# Patient Record
Sex: Female | Born: 1970
Health system: Southern US, Community
[De-identification: ages and names within clinical notes are randomized; demographics above are authoritative.]

## PROBLEM LIST (undated history)

## (undated) DIAGNOSIS — E785 Hyperlipidemia, unspecified: Secondary | ICD-10-CM

## (undated) DIAGNOSIS — F419 Anxiety disorder, unspecified: Secondary | ICD-10-CM

## (undated) HISTORY — DX: Hyperlipidemia, unspecified: E78.5

## (undated) HISTORY — PX: TUBAL LIGATION: SHX77

## (undated) HISTORY — PX: ABDOMINAL HYSTERECTOMY: SHX81

## (undated) HISTORY — DX: Anxiety disorder, unspecified: F41.9

## (undated) HISTORY — PX: OOPHORECTOMY: SHX86

## (undated) HISTORY — PX: TONSILLECTOMY: SUR1361

---

## 2015-05-12 DIAGNOSIS — M5136 Other intervertebral disc degeneration, lumbar region: Secondary | ICD-10-CM | POA: Insufficient documentation

## 2015-05-12 DIAGNOSIS — G8929 Other chronic pain: Secondary | ICD-10-CM | POA: Insufficient documentation

## 2015-05-12 DIAGNOSIS — F410 Panic disorder [episodic paroxysmal anxiety] without agoraphobia: Secondary | ICD-10-CM | POA: Insufficient documentation

## 2015-05-12 DIAGNOSIS — M7130 Other bursal cyst, unspecified site: Secondary | ICD-10-CM | POA: Insufficient documentation

## 2015-05-12 DIAGNOSIS — M542 Cervicalgia: Secondary | ICD-10-CM | POA: Insufficient documentation

## 2015-05-12 DIAGNOSIS — M8580 Other specified disorders of bone density and structure, unspecified site: Secondary | ICD-10-CM | POA: Insufficient documentation

## 2015-05-12 DIAGNOSIS — G43009 Migraine without aura, not intractable, without status migrainosus: Secondary | ICD-10-CM | POA: Insufficient documentation

## 2017-12-19 DIAGNOSIS — Z1231 Encounter for screening mammogram for malignant neoplasm of breast: Secondary | ICD-10-CM | POA: Diagnosis not present

## 2018-03-09 DIAGNOSIS — N3946 Mixed incontinence: Secondary | ICD-10-CM | POA: Diagnosis not present

## 2018-03-09 DIAGNOSIS — R3121 Asymptomatic microscopic hematuria: Secondary | ICD-10-CM | POA: Diagnosis not present

## 2018-03-09 DIAGNOSIS — R3982 Chronic bladder pain: Secondary | ICD-10-CM | POA: Diagnosis not present

## 2018-03-09 DIAGNOSIS — N3001 Acute cystitis with hematuria: Secondary | ICD-10-CM | POA: Diagnosis not present

## 2018-03-10 DIAGNOSIS — E782 Mixed hyperlipidemia: Secondary | ICD-10-CM | POA: Diagnosis not present

## 2018-03-10 DIAGNOSIS — R5383 Other fatigue: Secondary | ICD-10-CM | POA: Diagnosis not present

## 2018-03-12 DIAGNOSIS — R799 Abnormal finding of blood chemistry, unspecified: Secondary | ICD-10-CM | POA: Diagnosis not present

## 2018-04-15 DIAGNOSIS — N393 Stress incontinence (female) (male): Secondary | ICD-10-CM | POA: Diagnosis not present

## 2018-04-15 DIAGNOSIS — R350 Frequency of micturition: Secondary | ICD-10-CM | POA: Diagnosis not present

## 2018-04-15 DIAGNOSIS — N3021 Other chronic cystitis with hematuria: Secondary | ICD-10-CM | POA: Diagnosis not present

## 2018-04-29 DIAGNOSIS — R3121 Asymptomatic microscopic hematuria: Secondary | ICD-10-CM | POA: Diagnosis not present

## 2018-04-29 DIAGNOSIS — N281 Cyst of kidney, acquired: Secondary | ICD-10-CM | POA: Diagnosis not present

## 2018-05-20 DIAGNOSIS — R799 Abnormal finding of blood chemistry, unspecified: Secondary | ICD-10-CM | POA: Diagnosis not present

## 2018-05-20 DIAGNOSIS — N3946 Mixed incontinence: Secondary | ICD-10-CM | POA: Diagnosis not present

## 2018-05-20 DIAGNOSIS — E782 Mixed hyperlipidemia: Secondary | ICD-10-CM | POA: Diagnosis not present

## 2018-05-28 DIAGNOSIS — N3021 Other chronic cystitis with hematuria: Secondary | ICD-10-CM | POA: Diagnosis not present

## 2018-05-28 DIAGNOSIS — R350 Frequency of micturition: Secondary | ICD-10-CM | POA: Diagnosis not present

## 2019-02-17 LAB — HM MAMMOGRAPHY

## 2019-04-19 ENCOUNTER — Other Ambulatory Visit: Payer: Self-pay | Admitting: Physician Assistant

## 2019-05-22 ENCOUNTER — Other Ambulatory Visit: Payer: Self-pay | Admitting: Physician Assistant

## 2019-05-31 ENCOUNTER — Encounter: Payer: Self-pay | Admitting: Physician Assistant

## 2019-05-31 ENCOUNTER — Other Ambulatory Visit: Payer: Self-pay

## 2019-05-31 ENCOUNTER — Ambulatory Visit: Payer: Commercial Managed Care - PPO | Admitting: Physician Assistant

## 2019-05-31 VITALS — BP 126/64 | HR 72 | Temp 97.4°F | Resp 16 | Ht 67.0 in | Wt 160.0 lb

## 2019-05-31 DIAGNOSIS — E782 Mixed hyperlipidemia: Secondary | ICD-10-CM | POA: Diagnosis not present

## 2019-05-31 DIAGNOSIS — F419 Anxiety disorder, unspecified: Secondary | ICD-10-CM | POA: Insufficient documentation

## 2019-05-31 DIAGNOSIS — N3010 Interstitial cystitis (chronic) without hematuria: Secondary | ICD-10-CM | POA: Diagnosis not present

## 2019-05-31 NOTE — Assessment & Plan Note (Signed)
Well controlled.  ?No changes to medicines.  ?Continue to work on eating a healthy diet and exercise.  ?Labs drawn today.  ?

## 2019-05-31 NOTE — Progress Notes (Signed)
Established Patient Office Visit  Subjective:  Patient ID: Jeanne Jacobson, female    DOB: 14-Nov-1970  Age: 49 y.o. MRN: [id number]  CC:  Chief Complaint  Patient presents with  . Hyperlipidemia    HPI Jeanne Jacobson presents for follow up hyperlipidemia  Mixed hyperlipidemia  Pt presents with hyperlipidemia.  Compliance with treatment has been good; The patient is compliant with medications, maintains a low cholesterol diet , follows up as directed.  The patient denies experiencing any hypercholesterolemia related symptoms.  Evidenced based information based on history , exam, and other sources has been used  for decision making. Pt currently on crestor 28m qd  Pt with history of anxiety - states symptoms controlled well with ativan 0.52m- she takes nightly  Pt has symptoms consistent with probable interstitial cystitis - she did have urology workup last year and was supposed to return for further testing if her symptoms persisted.  She states she never went back and has intermittent symptoms - states she would consider possible second opinion because she did not necessarily like the visit she had with [facility]   Past Medical History:  Diagnosis Date  . Anxiety   . Hyperlipidemia     Past Surgical History:  Procedure Laterality Date  . ABDOMINAL HYSTERECTOMY    . TUBAL LIGATION      Family History  Problem Relation Age of Onset  . Coronary artery disease Father     Social History   Socioeconomic History  . Marital status: Married    Spouse name: Not on file  . Number of children: 2  . Years of education: Not on file  . Highest education level: Not on file  Occupational History  . Occupation: [employer]  Tobacco Use  . Smoking status: Never Smoker  . Smokeless tobacco: Never Used  Substance and Sexual Activity  . Alcohol use: Yes    Comment: social  . Drug use: Never  . Sexual activity: Not on file  Other Topics Concern  . Not on file  Social  History Narrative  . Not on file   Social Determinants of Health   Financial Resource Strain:   . Difficulty of Paying Living Expenses:   Food Insecurity:   . Worried About RuCharity fundraisern the Last Year:   . RaArboriculturistn the Last Year:   Transportation Needs:   . LaFilm/video editorMedical):   . Marland Kitchenack of Transportation (Non-Medical):   Physical Activity:   . Days of Exercise per Week:   . Minutes of Exercise per Session:   Stress:   . Feeling of Stress :   Social Connections:   . Frequency of Communication with Friends and Family:   . Frequency of Social Gatherings with Friends and Family:   . Attends Religious Services:   . Active Member of Clubs or Organizations:   . Attends ClArchivisteetings:   . Marland Kitchenarital Status:   Intimate Partner Violence:   . Fear of Current or Ex-Partner:   . Emotionally Abused:   . Marland Kitchenhysically Abused:   . Sexually Abused:      Current Outpatient Medications:  .  gabapentin (NEURONTIN) 300 MG capsule, TAKE 1 CAPSULE EVERY MORNING AND 2 CAPSULES AT BEDTIME, Disp: , Rfl:  .  estradiol (ESTRACE) 2 MG tablet, Take 2 mg by mouth daily., Disp: , Rfl:  .  LORazepam (ATIVAN) 0.5 MG tablet, TAKE 1 TABLET BY MOUTH ONCE DAILY AS NEEDED, Disp: 30  tablet, Rfl: 0 .  rosuvastatin (CRESTOR) 10 MG tablet, TAKE 1 TABLET DAILY, Disp: 90 tablet, Rfl: 3   Allergies  Allergen Reactions  . Penicillins     ROS CONSTITUTIONAL: Negative for chills, fatigue, fever, unintentional weight gain and unintentional weight loss.  E/N/T: Negative for ear pain, nasal congestion and sore throat.  CARDIOVASCULAR: Negative for chest pain, dizziness, palpitations and pedal edema.  RESPIRATORY: Negative for recent cough and dyspnea.  GASTROINTESTINAL: Negative for abdominal pain, acid reflux symptoms, constipation, diarrhea, nausea and vomiting.  GU - see HPI INTEGUMENTARY: Negative for rash.   PSYCHIATRIC: Negative for sleep disturbance and to question  depression screen.  Negative for depression, negative for anhedonia.        Objective:    PHYSICAL EXAM:   VS: BP 126/64   Pulse 72   Temp (!) 97.4 F (36.3 C)   Resp 16   Ht 5' 7"  (1.702 m)   Wt 160 lb (72.6 kg)   SpO2 99%   BMI 25.06 kg/m   GEN: Well nourished, well developed, in no acute distress   Cardiac: RRR; no murmurs, rubs, or gallops,no edema - no significant varicosities Respiratory:  normal respiratory rate and pattern with no distress - normal breath sounds with no rales, rhonchi, wheezes or rubs  MS: no deformity or atrophy  Skin: warm and dry, no rash  Neuro:  Alert and Oriented x 3, Strength and sensation are intact - CN II-Xii grossly intact Psych: euthymic mood, appropriate affect and demeanor  BP 126/64   Pulse 72   Temp (!) 97.4 F (36.3 C)   Resp 16   Ht 5' 7"  (1.702 m)   Wt 160 lb (72.6 kg)   SpO2 99%   BMI 25.06 kg/m  Wt Readings from Last 3 Encounters:  05/31/19 160 lb (72.6 kg)     Health Maintenance Due  Topic Date Due  . HIV Screening  Never done  . TETANUS/TDAP  Never done  . PAP SMEAR-Modifier  Never done  . INFLUENZA VACCINE  Never done    There are no preventive care reminders to display for this patient.  No results found for: TSH No results found for: WBC, HGB, HCT, MCV, PLT No results found for: NA, K, CHLORIDE, CO2, GLUCOSE, BUN, CREATININE, BILITOT, ALKPHOS, AST, ALT, PROT, ALBUMIN, CALCIUM, ANIONGAP, EGFR, GFR No results found for: CHOL No results found for: HDL No results found for: LDLCALC No results found for: TRIG No results found for: CHOLHDL No results found for: HGBA1C    Assessment & Plan:   Problem List Items Addressed This Visit      Genitourinary   Interstitial cystitis    Handout given for diet guidelines Pt will try this and if not helpful will call back and get second opinion        Other   Mixed hyperlipidemia - Primary    Well controlled.  No changes to medicines.  Continue to work on  eating a healthy diet and exercise.  Labs drawn today.        Relevant Orders   Comprehensive metabolic panel   Lipid panel   Anxiety    Continue current meds as directed        conti No orders of the defined types were placed in this encounter.   Follow-up: Return in about 6 months (around 12/01/2019) for chronic fasting follow up.    SARA R Dearia Wilmouth, PA-C

## 2019-05-31 NOTE — Assessment & Plan Note (Signed)
Continue current meds as directed 

## 2019-05-31 NOTE — Assessment & Plan Note (Signed)
Handout given for diet guidelines Pt will try this and if not helpful will call back and get second opinion

## 2019-05-31 NOTE — Patient Instructions (Signed)
Eating Plan for Interstitial Cystitis Interstitial cystitis (IC) is a long-term (chronic) condition that causes pain and pressure in the bladder, the lower abdomen, and the pelvic area. Other symptoms of IC include urinary urgency and frequency. Symptoms tend to come and go. Many people with IC find that certain foods trigger their symptoms. Different foods may be problematic for different people. Some foods are more likely to cause symptoms than others. Learning which foods bother you and which do not can help you come up with an eating plan to manage IC. What are tips for following this plan? You may find it helpful to work with a dietitian. This health care provider can help you develop your eating plan by doing an elimination diet, which involves these steps:  Start with a list of foods that you think trigger your IC symptoms along with the foods that most commonly trigger symptoms for many people with IC.  Eliminate those foods from your diet for about one month, then start reintroducing the foods one at a time to see which ones trigger your symptoms.  Make a list of the foods that trigger your symptoms. It may take several months to find out which foods bother you. Reading food labels Once you know which foods trigger your IC symptoms, you can avoid them. However, it is also a good idea to read food labels because some foods that trigger your symptoms may be included as ingredients in other foods. These ingredients may include:  Chili peppers.  Tomato products.  Soy.  Worcestershire sauce.  Vinegar.  Alcohol.  Citrus flavors or juices.  Artificial sweeteners.  Monosodium glutamate. Shopping  Shopping can be a challenge if many foods trigger your IC. When you go grocery shopping, bring a list of the foods you can eat.  You can get an app for your phone that lets you know which foods are the safest and which you may want to avoid. You can find the app at the Interstitial  Cystitis Network website: www.ic-network.com Meal planning  Plan your meals according to the results of your elimination diet. If you have not done an elimination diet, plan meals according to IC food lists recommended by your health care provider or dietitian. These lists tell you which foods are least and most likely to cause symptoms.  Avoid certain types of food when you go out to eat, such as pizza and foods typically served at Indian, Mexican, and Thai restaurants. These foods often contain ingredients that can aggravate IC. General information Here are some general guidelines for an IC eating plan:  Do not eat large portions.  Drink plenty of fluids with your meals.  Do not eat foods that are high in sugar, salt, or saturated fat.  Choose whole fruits instead of juice.  Eat a colorful variety of vegetables. What foods should I eat? For people with IC, the best diet is a balanced one that includes things from all the food groups. Even if you have to avoid certain foods, there are still plenty of healthy choices in each group. The following are some foods that are least bothersome and may be safest to eat: Fruits Bananas. Blueberries and blueberry juice. Melons. Pears. Apples. Dates. Prunes. Raisins. Apricots. Vegetables Asparagus. Avocado. Celery. Beets. Bell peppers. Black olives. Broccoli. Brussels sprouts. Cabbage. Carrots. Cauliflower. Cucumber. Eggplant. Green beans. Potatoes. Radishes. Spinach. Squash. Turnips. Zucchini. Mushrooms. Peas. Grains Oats. Rice. Bran. Oatmeal. Whole wheat bread. Meats and other proteins Beef. Fish and other seafood. Eggs. Nuts.   Peanut butter. Pork. Poultry. Lamb. Garbanzo beans. Pinto beans. Dairy Whole or low-fat milk. American, mozzarella, mild cheddar, feta, ricotta, and cream cheeses. The items listed above may not be a complete list of foods and beverages you can eat. Contact a dietitian for more information. What foods should I avoid? You  should avoid any foods that seem to trigger your symptoms. It is also a good idea to avoid foods that are most likely to cause symptoms in many people with IC. These include the following: Fruits Citrus fruits, including lemons, limes, oranges, and grapefruit. Cranberries. Strawberries. Pineapple. Kiwi. Vegetables Chili peppers. Onions. Sauerkraut. Tomato and tomato products. Pickles. Grains You do not need to avoid any type of grain unless it triggers your symptoms. Meats and other proteins Precooked or cured meats, such as sausages or meat loaves. Soy products. Dairy Chocolate ice cream. Processed cheese. Yogurt. Beverages Alcohol. Chocolate drinks. Coffee. Cranberry juice. Carbonated drinks. Tea (black, green, or herbal). Tomato juice. Sports drinks. The items listed above may not be a complete list of foods and beverages you should avoid. Contact a dietitian for more information. Summary  Many people with IC find that certain foods trigger their symptoms. Different foods may be problematic for different people. Some foods are more likely to cause symptoms than others.  You may find it helpful to work with a dietitian to do an elimination diet and come up with an eating plan that is right for you.  Plan your meals according to the results of your elimination diet. If you have not done an elimination diet, plan your meals using IC food lists. These lists tell you which foods are least and most likely to cause symptoms.  The best diet for people with IC is a balanced diet that includes foods from all the food groups. Even if you have to avoid certain foods, there are still plenty of healthy choices in each group. This information is not intended to replace advice given to you by your health care provider. Make sure you discuss any questions you have with your health care provider. Document Revised: 06/18/2018 Document Reviewed: 10/30/2017 Elsevier Patient Education  2020 Elsevier Inc.  

## 2019-06-01 LAB — COMPREHENSIVE METABOLIC PANEL
ALT: 8 IU/L (ref 0–32)
AST: 11 IU/L (ref 0–40)
Albumin/Globulin Ratio: 1.8 (ref 1.2–2.2)
Albumin: 4.4 g/dL (ref 3.8–4.8)
Alkaline Phosphatase: 58 IU/L (ref 39–117)
BUN/Creatinine Ratio: 16 (ref 9–23)
BUN: 14 mg/dL (ref 6–24)
Bilirubin Total: 0.2 mg/dL (ref 0.0–1.2)
CO2: 23 mmol/L (ref 20–29)
Calcium: 9.5 mg/dL (ref 8.7–10.2)
Chloride: 104 mmol/L (ref 96–106)
Creatinine, Ser: 0.88 mg/dL (ref 0.57–1.00)
GFR calc Af Amer: 89 mL/min/{1.73_m2} (ref >59)
GFR calc non Af Amer: 77 mL/min/{1.73_m2} (ref >59)
Globulin, Total: 2.5 g/dL (ref 1.5–4.5)
Glucose: 93 mg/dL (ref 65–99)
Potassium: 5.1 mmol/L (ref 3.5–5.2)
Sodium: 141 mmol/L (ref 134–144)
Total Protein: 6.9 g/dL (ref 6.0–8.5)

## 2019-06-01 LAB — LIPID PANEL
Chol/HDL Ratio: 2.5 ratio (ref 0.0–4.4)
Cholesterol, Total: 171 mg/dL (ref 100–199)
HDL: 68 mg/dL (ref >39)
LDL Chol Calc (NIH): 75 mg/dL (ref 0–99)
Triglycerides: 169 mg/dL — ABNORMAL HIGH (ref 0–149)
VLDL Cholesterol Cal: 28 mg/dL (ref 5–40)

## 2019-06-01 LAB — CARDIOVASCULAR RISK ASSESSMENT

## 2019-06-04 ENCOUNTER — Other Ambulatory Visit: Payer: Self-pay | Admitting: Physician Assistant

## 2019-06-08 ENCOUNTER — Other Ambulatory Visit: Payer: Self-pay | Admitting: Physician Assistant

## 2019-08-10 ENCOUNTER — Other Ambulatory Visit: Payer: Self-pay | Admitting: Physician Assistant

## 2019-08-12 ENCOUNTER — Other Ambulatory Visit: Payer: Self-pay | Admitting: Physician Assistant

## 2019-09-11 ENCOUNTER — Other Ambulatory Visit: Payer: Self-pay | Admitting: Physician Assistant

## 2019-09-21 ENCOUNTER — Telehealth (INDEPENDENT_AMBULATORY_CARE_PROVIDER_SITE_OTHER): Payer: Commercial Managed Care - PPO | Admitting: Physician Assistant

## 2019-09-21 ENCOUNTER — Encounter: Payer: Self-pay | Admitting: Physician Assistant

## 2019-09-21 VITALS — Wt 160.0 lb

## 2019-09-21 DIAGNOSIS — J060 Acute laryngopharyngitis: Secondary | ICD-10-CM | POA: Diagnosis not present

## 2019-09-21 MED ORDER — AZITHROMYCIN 250 MG PO TABS: ORAL_TABLET | ORAL | 0 refills | Status: DC

## 2019-09-21 NOTE — Assessment & Plan Note (Signed)
rx for zpack as directed Continue decongestants Salt water gargles

## 2019-09-21 NOTE — Progress Notes (Signed)
Virtual Visit via Telephone Note   This visit type was conducted due to national recommendations for restrictions regarding the COVID-19 Pandemic (e.g. social distancing) in an effort to limit this patient's exposure and mitigate transmission in our community.  Due to her co-morbid illnesses, this patient is at least at moderate risk for complications without adequate follow up.  This format is felt to be most appropriate for this patient at this time.  The patient did not have access to video technology/had technical difficulties with video requiring transitioning to audio format only (telephone).  All issues noted in this document were discussed and addressed.  No physical exam could be performed with this format.  Patient verbally consented to a telehealth visit.   Date:  09/21/2019   ID:  Beckey Downing, DOB [birth year], MRN [id number]  Patient Location: Home Provider Location: Office  PCP:  Marianne Sofia, PA-C    Chief Complaint:  URI  History of Present Illness:    Laelah Matsushita is a 49 y.o. female with uri Pt states for the past week she has had mild sore throat, sinus drainage, facial pressure and pnd - is taking otc decongestants Denies fever ,malaise or cough Does feel left ear stopped up   The patient does not have symptoms concerning for COVID-19 infection (fever, chills, cough, or new shortness of breath).    Past Medical History:  Diagnosis Date  . Anxiety   . Hyperlipidemia    Past Surgical History:  Procedure Laterality Date  . ABDOMINAL HYSTERECTOMY    . TUBAL LIGATION       Current Meds  Medication Sig  . estradiol (ESTRACE) 2 MG tablet TAKE 1 TABLET DAILY  . gabapentin (NEURONTIN) 300 MG capsule TAKE 1 CAPSULE EVERY MORNING AND 2 CAPSULES AT BEDTIME  . loratadine (CLARITIN) 10 MG tablet Take 10 mg by mouth daily.  Marland Kitchen LORazepam (ATIVAN) 0.5 MG tablet TAKE 1 TABLET BY MOUTH EVERY DAY AS NEEDED  . Phenylephrine-APAP-guaiFENesin (MUCINEX SINUS-MAX CONG &  PAIN PO) Take by mouth.  . rosuvastatin (CRESTOR) 10 MG tablet TAKE 1 TABLET DAILY     Allergies:   Penicillins   Social History   Tobacco Use  . Smoking status: Never Smoker  . Smokeless tobacco: Never Used  Vaping Use  . Vaping Use: Never used  Substance Use Topics  . Alcohol use: Yes    Comment: social  . Drug use: Never     Family Hx: The patient's family history includes Coronary artery disease in her father.  ROS:   Please see the history of present illness.    All other systems reviewed and are negative.  Labs/Other Tests and Data Reviewed:    Recent Labs: 05/31/2019: ALT 8; BUN 14; Creatinine, Ser 0.88; Potassium 5.1; Sodium 141   Recent Lipid Panel Lab Results  Component Value Date/Time   CHOL 171 05/31/2019 08:56 AM   TRIG 169 (H) 05/31/2019 08:56 AM   HDL 68 05/31/2019 08:56 AM   CHOLHDL 2.5 05/31/2019 08:56 AM   LDLCALC 75 05/31/2019 08:56 AM    Wt Readings from Last 3 Encounters:  09/21/19 160 lb (72.6 kg)  05/31/19 160 lb (72.6 kg)     Objective:    Vital Signs:  Wt 160 lb (72.6 kg)   BMI 25.06 kg/m    VITAL SIGNS:  reviewed  ASSESSMENT & PLAN:    1. URI - rx for zpack - continue decongestants - recommend salt water gargles  COVID-19 Education: The signs and symptoms  of COVID-19 were discussed with the patient and how to seek care for testing (follow up with PCP or arrange E-visit). The importance of social distancing was discussed today.  Time:   Today, I have spent 10 minutes with the patient with telehealth technology discussing the above problems.     Medication Adjustments/Labs and Tests Ordered: Current medicines are reviewed at length with the patient today.  Concerns regarding medicines are outlined above.   Tests Ordered: No orders of the defined types were placed in this encounter.   Medication Changes: No orders of the defined types were placed in this encounter.   Follow Up:  In Person prn  Signed, Vickey Sages,  PA-C  09/21/2019 2:05 PM    [facility] [employer]

## 2019-10-15 ENCOUNTER — Other Ambulatory Visit: Payer: Self-pay | Admitting: Physician Assistant

## 2019-11-10 ENCOUNTER — Other Ambulatory Visit: Payer: Self-pay | Admitting: Physician Assistant

## 2019-11-29 ENCOUNTER — Other Ambulatory Visit: Payer: Self-pay

## 2019-11-29 ENCOUNTER — Encounter: Payer: Self-pay | Admitting: Physician Assistant

## 2019-11-29 ENCOUNTER — Ambulatory Visit: Payer: Commercial Managed Care - PPO | Admitting: Physician Assistant

## 2019-11-29 VITALS — BP 118/74 | HR 76 | Temp 97.3°F | Ht 67.0 in | Wt 159.0 lb

## 2019-11-29 DIAGNOSIS — M5136 Other intervertebral disc degeneration, lumbar region: Secondary | ICD-10-CM | POA: Diagnosis not present

## 2019-11-29 DIAGNOSIS — N3010 Interstitial cystitis (chronic) without hematuria: Secondary | ICD-10-CM

## 2019-11-29 DIAGNOSIS — E782 Mixed hyperlipidemia: Secondary | ICD-10-CM | POA: Diagnosis not present

## 2019-11-29 DIAGNOSIS — F419 Anxiety disorder, unspecified: Secondary | ICD-10-CM

## 2019-11-29 DIAGNOSIS — Z1211 Encounter for screening for malignant neoplasm of colon: Secondary | ICD-10-CM

## 2019-11-29 NOTE — Progress Notes (Signed)
Established Patient Office Visit  Subjective:  Patient ID: Jeanne Jacobson, female    DOB: 1970-07-01  Age: 49 y.o. MRN: [id number]  CC:  Chief Complaint  Patient presents with   Hyperlipidemia    HPI Jeanne Jacobson presents for follow up hyperlipidemia  Mixed hyperlipidemia  Pt presents with hyperlipidemia.  Compliance with treatment has been good; The patient is compliant with medications, maintains a low cholesterol diet , follows up as directed.  The patient denies experiencing any hypercholesterolemia related symptoms.  Evidenced based information based on history , exam, and other sources has been used  for decision making. Pt currently on crestor 10mg  qd  Pt with history of anxiety - states symptoms controlled well with ativan 0.5mg  - she takes nightly  Pt has symptoms consistent with probable interstitial cystitis - she has had a workup in the past with [facility] and would like a second opinion  Pt with symptoms of DJD and chronic recurrent low back and bilateral hip pain - she has been seeing chiropractor but would like ortho referral for further evaluation and treatment  Pt would like to be scheduled for screening colonoscopy   Past Medical History:  Diagnosis Date   Anxiety    Hyperlipidemia     Past Surgical History:  Procedure Laterality Date   ABDOMINAL HYSTERECTOMY     TUBAL LIGATION      Family History  Problem Relation Age of Onset   Coronary artery disease Father     Social History   Socioeconomic History   Marital status: Married    Spouse name: Not on file   Number of children: 2   Years of education: Not on file   Highest education level: Not on file  Occupational History   Occupation:  Tobacco Use   Smoking status: Never Smoker   Smokeless tobacco: Never Used  Sports administrator Use: Never used  Substance and Sexual Activity   Alcohol use: Yes    Comment: social   Drug use: Never   Sexual  activity: Not on file  Other Topics Concern   Not on file  Social History Narrative   Not on file   Social Determinants of Health   Financial Resource Strain:    Difficulty of Paying Living Expenses: Not on file  Food Insecurity:    Worried About Building services engineer in the Last Year: Not on file   Programme researcher, broadcasting/film/video of Food in the Last Year: Not on file  Transportation Needs:    Lack of Transportation (Medical): Not on file   Lack of Transportation (Non-Medical): Not on file  Physical Activity:    Days of Exercise per Week: Not on file   Minutes of Exercise per Session: Not on file  Stress:    Feeling of Stress : Not on file  Social Connections:    Frequency of Communication with Friends and Family: Not on file   Frequency of Social Gatherings with Friends and Family: Not on file   Attends Religious Services: Not on file   Active Member of Clubs or Organizations: Not on file   Attends [employer] Meetings: Not on file   Marital Status: Not on file  Intimate Partner Violence:    Fear of Current or Ex-Partner: Not on file   Emotionally Abused: Not on file   Physically Abused: Not on file   Sexually Abused: Not on file     Current Outpatient Medications:    estradiol (ESTRACE)  2 MG tablet, TAKE 1 TABLET DAILY, Disp: 90 tablet, Rfl: 0   gabapentin (NEURONTIN) 100 MG capsule, Take by mouth., Disp: , Rfl:    LORazepam (ATIVAN) 0.5 MG tablet, TAKE 1 TABLET BY MOUTH EVERY DAY AS NEEDED, Disp: 30 tablet, Rfl: 1   rosuvastatin (CRESTOR) 10 MG tablet, TAKE 1 TABLET DAILY, Disp: 90 tablet, Rfl: 3   Allergies  Allergen Reactions   Penicillins     ROS CONSTITUTIONAL: Negative for chills, fatigue, fever, unintentional weight gain and unintentional weight loss.  E/N/T: Negative for ear pain, nasal congestion and sore throat.  CARDIOVASCULAR: Negative for chest pain, dizziness, palpitations and pedal edema.  RESPIRATORY: Negative for recent cough and dyspnea.   GASTROINTESTINAL: Negative for abdominal pain, acid reflux symptoms, constipation, diarrhea, nausea and vomiting.  GU - see HPI MUSC - see HPI INTEGUMENTARY: Negative for rash.   PSYCHIATRIC: Negative for sleep disturbance and to question depression screen.  Negative for depression, negative for anhedonia.        Objective:    PHYSICAL EXAM:   VS: BP 118/74 (BP Location: Left Arm, Patient Position: Sitting, Cuff Size: Normal)    Pulse 76    Temp (!) 97.3 F (36.3 C) (Temporal)    Ht 5\' 7"  (1.702 m)    Wt 159 lb (72.1 kg)    SpO2 100%    BMI 24.90 kg/m   GEN: Well nourished, well developed, in no acute distress   Cardiac: RRR; no murmurs, rubs, or gallops,no edema - no significant varicosities Respiratory:  normal respiratory rate and pattern with no distress - normal breath sounds with no rales, rhonchi, wheezes or rubs  MS: no deformity or atrophy  Skin: warm and dry, no rash  Neuro:  Alert and Oriented x 3, Strength and sensation are intact - CN II-Xii grossly intact Psych: euthymic mood, appropriate affect and demeanor  BP 118/74 (BP Location: Left Arm, Patient Position: Sitting, Cuff Size: Normal)    Pulse 76    Temp (!) 97.3 F (36.3 C) (Temporal)    Ht 5\' 7"  (1.702 m)    Wt 159 lb (72.1 kg)    SpO2 100%    BMI 24.90 kg/m  Wt Readings from Last 3 Encounters:  11/29/19 159 lb (72.1 kg)  09/21/19 160 lb (72.6 kg)  05/31/19 160 lb (72.6 kg)     Health Maintenance Due  Topic Date Due   COLONOSCOPY  Never done    There are no preventive care reminders to display for this patient.  No results found for: TSH No results found for: WBC, HGB, HCT, MCV, PLT Lab Results  Component Value Date   NA 141 05/31/2019   K 5.1 05/31/2019   CO2 23 05/31/2019   GLUCOSE 93 05/31/2019   BUN 14 05/31/2019   CREATININE 0.88 05/31/2019   BILITOT 0.2 05/31/2019   ALKPHOS 58 05/31/2019   AST 11 05/31/2019   ALT 8 05/31/2019   PROT 6.9 05/31/2019   ALBUMIN 4.4 05/31/2019    CALCIUM 9.5 05/31/2019   Lab Results  Component Value Date   CHOL 171 05/31/2019   Lab Results  Component Value Date   HDL 68 05/31/2019   Lab Results  Component Value Date   LDLCALC 75 05/31/2019   Lab Results  Component Value Date   TRIG 169 (H) 05/31/2019   Lab Results  Component Value Date   CHOLHDL 2.5 05/31/2019   No results found for: HGBA1C    Assessment & Plan:  Problem List Items Addressed This Visit      Musculoskeletal and Integument   DDD (degenerative disc disease), lumbar    Pt would like opinion from ortho for chronic back and hip pain (dating back to fall in 2007) Referral made      Relevant Orders   Ambulatory referral to Orthopedic Surgery     Genitourinary   Interstitial cystitis    Pt would like second opinion - had seen [facility] in past -  Referral  made      Relevant Orders   Ambulatory referral to Urology     Other   Mixed hyperlipidemia - Primary    Well controlled.  No changes to medicines.  Continue to work on eating a healthy diet and exercise.  Labs drawn today.        Relevant Orders   CBC with Differential/Platelet   Comprehensive metabolic panel   TSH   Lipid panel   Anxiety    Continue current meds as directed      Relevant Orders   TSH   Screening for colon cancer    Referral to Dr Jennye Boroughs      Relevant Orders   Ambulatory referral to Gastroenterology     conti No orders of the defined types were placed in this encounter.   Follow-up: Return in about 6 months (around 05/28/2020) for chronic fasting follow up.    SARA R Michon Kaczmarek, PA-C

## 2019-11-29 NOTE — Assessment & Plan Note (Signed)
Continue current meds as directed 

## 2019-11-29 NOTE — Assessment & Plan Note (Signed)
Referral to Dr Misenheimer 

## 2019-11-29 NOTE — Assessment & Plan Note (Signed)
Pt would like opinion from ortho for chronic back and hip pain (dating back to fall in 2007) Referral made

## 2019-11-29 NOTE — Assessment & Plan Note (Signed)
Pt would like second opinion - had seen [facility] in past -  Referral  made

## 2019-11-29 NOTE — Assessment & Plan Note (Signed)
Well controlled.  ?No changes to medicines.  ?Continue to work on eating a healthy diet and exercise.  ?Labs drawn today.  ?

## 2019-11-30 LAB — COMPREHENSIVE METABOLIC PANEL
ALT: 9 IU/L (ref 0–32)
AST: 11 IU/L (ref 0–40)
Albumin/Globulin Ratio: 1.6 (ref 1.2–2.2)
Albumin: 4.2 g/dL (ref 3.8–4.8)
Alkaline Phosphatase: 54 IU/L (ref 44–121)
BUN/Creatinine Ratio: 15 (ref 9–23)
BUN: 12 mg/dL (ref 6–24)
Bilirubin Total: 0.3 mg/dL (ref 0.0–1.2)
CO2: 23 mmol/L (ref 20–29)
Calcium: 9.2 mg/dL (ref 8.7–10.2)
Chloride: 104 mmol/L (ref 96–106)
Creatinine, Ser: 0.78 mg/dL (ref 0.57–1.00)
GFR calc Af Amer: 103 mL/min/{1.73_m2} (ref >59)
GFR calc non Af Amer: 89 mL/min/{1.73_m2} (ref >59)
Globulin, Total: 2.6 g/dL (ref 1.5–4.5)
Glucose: 96 mg/dL (ref 65–99)
Potassium: 4.4 mmol/L (ref 3.5–5.2)
Sodium: 139 mmol/L (ref 134–144)
Total Protein: 6.8 g/dL (ref 6.0–8.5)

## 2019-11-30 LAB — CBC WITH DIFFERENTIAL/PLATELET
Basophils Absolute: 0 10*3/uL (ref 0.0–0.2)
Basos: 1 %
EOS (ABSOLUTE): 0.1 10*3/uL (ref 0.0–0.4)
Eos: 2 %
Hematocrit: 36.7 % (ref 34.0–46.6)
Hemoglobin: 12.5 g/dL (ref 11.1–15.9)
Immature Grans (Abs): 0 10*3/uL (ref 0.0–0.1)
Immature Granulocytes: 0 %
Lymphocytes Absolute: 2.2 10*3/uL (ref 0.7–3.1)
Lymphs: 49 %
MCH: 30.5 pg (ref 26.6–33.0)
MCHC: 34.1 g/dL (ref 31.5–35.7)
MCV: 90 fL (ref 79–97)
Monocytes Absolute: 0.3 10*3/uL (ref 0.1–0.9)
Monocytes: 7 %
Neutrophils Absolute: 1.8 10*3/uL (ref 1.4–7.0)
Neutrophils: 41 %
Platelets: 207 10*3/uL (ref 150–450)
RBC: 4.1 x10E6/uL (ref 3.77–5.28)
RDW: 12.3 % (ref 11.7–15.4)
WBC: 4.4 10*3/uL (ref 3.4–10.8)

## 2019-11-30 LAB — LIPID PANEL
Chol/HDL Ratio: 2.6 ratio (ref 0.0–4.4)
Cholesterol, Total: 153 mg/dL (ref 100–199)
HDL: 58 mg/dL (ref >39)
LDL Chol Calc (NIH): 72 mg/dL (ref 0–99)
Triglycerides: 131 mg/dL (ref 0–149)
VLDL Cholesterol Cal: 23 mg/dL (ref 5–40)

## 2019-11-30 LAB — TSH: TSH: 2.92 u[IU]/mL (ref 0.450–4.500)

## 2019-11-30 LAB — CARDIOVASCULAR RISK ASSESSMENT

## 2020-01-19 ENCOUNTER — Other Ambulatory Visit: Payer: Self-pay | Admitting: Physician Assistant

## 2020-02-08 ENCOUNTER — Other Ambulatory Visit: Payer: Self-pay | Admitting: Physician Assistant

## 2020-03-14 ENCOUNTER — Encounter: Payer: Self-pay | Admitting: Physician Assistant

## 2020-03-14 ENCOUNTER — Other Ambulatory Visit: Payer: Self-pay | Admitting: Physician Assistant

## 2020-03-14 ENCOUNTER — Telehealth (INDEPENDENT_AMBULATORY_CARE_PROVIDER_SITE_OTHER): Payer: 59 | Admitting: Physician Assistant

## 2020-03-14 VITALS — Ht 67.0 in | Wt 160.0 lb

## 2020-03-14 DIAGNOSIS — J018 Other acute sinusitis: Secondary | ICD-10-CM | POA: Diagnosis not present

## 2020-03-14 MED ORDER — FLUTICASONE PROPIONATE 50 MCG/ACT NA SUSP: 2.0000 | Freq: Every day | NASAL | 6 refills | Status: DC

## 2020-03-14 MED ORDER — AZITHROMYCIN 250 MG PO TABS: ORAL_TABLET | ORAL | 0 refills | Status: DC

## 2020-03-14 NOTE — Progress Notes (Signed)
Virtual Visit via Telephone Note   This visit type was conducted due to national recommendations for restrictions regarding the COVID-19 Pandemic (e.g. social distancing) in an effort to limit this patient's exposure and mitigate transmission in our community.  Due to her co-morbid illnesses, this patient is at least at moderate risk for complications without adequate follow up.  This format is felt to be most appropriate for this patient at this time.  The patient did not have access to video technology/had technical difficulties with video requiring transitioning to audio format only (telephone).  All issues noted in this document were discussed and addressed.  No physical exam could be performed with this format.  Patient verbally consented to a telehealth visit.   Date:  03/14/2020   ID:  Jeanne Jacobson, DOB 12-12-1970, MRN [id number]  Patient Location: Home Provider Location: Office  PCP:  Marianne Sofia, PA-C   Chief Complaint:  URI/sinusitis  History of Present Illness:    Jeanne Jacobson is a 50 y.o. female with sinus symptoms - has stuffiness in ears and throat - denies fever, cough,  Appetite normal Has not had sore throat -   The patient does not have symptoms concerning for COVID-19 infection (fever, chills, cough, or new shortness of breath).    Past Medical History:  Diagnosis Date  . Anxiety   . Hyperlipidemia    Past Surgical History:  Procedure Laterality Date  . ABDOMINAL HYSTERECTOMY    . TUBAL LIGATION       No outpatient medications have been marked as taking for the 03/14/20 encounter (Video Visit) with Marianne Sofia, PA-C.     Allergies:   Penicillins   Social History   Tobacco Use  . Smoking status: Never Smoker  . Smokeless tobacco: Never Used  Vaping Use  . Vaping Use: Never used  Substance Use Topics  . Alcohol use: Yes    Comment: social  . Drug use: Never     Family Hx: The patient's family history includes Coronary artery disease in her  father.  ROS:   Please see the history of present illness.    All other systems reviewed and are negative.  Labs/Other Tests and Data Reviewed:    Recent Labs: 11/29/2019: ALT 9; BUN 12; Creatinine, Ser 0.78; Hemoglobin 12.5; Platelets 207; Potassium 4.4; Sodium 139; TSH 2.920   Recent Lipid Panel Lab Results  Component Value Date/Time   CHOL 153 11/29/2019 08:59 AM   TRIG 131 11/29/2019 08:59 AM   HDL 58 11/29/2019 08:59 AM   CHOLHDL 2.6 11/29/2019 08:59 AM   LDLCALC 72 11/29/2019 08:59 AM    Wt Readings from Last 3 Encounters:  03/14/20 160 lb (72.6 kg)  11/29/19 159 lb (72.1 kg)  09/21/19 160 lb (72.6 kg)     Objective:    Vital Signs:  Ht 5\' 7"  (1.702 m)   Wt 160 lb (72.6 kg)   BMI 25.06 kg/m    VITAL SIGNS:  reviewed  ASSESSMENT & PLAN:    1. Sinusitis --- continue otc decongestants - will send in zpack and fluticasone to take as directed - follow up if any symptoms change or worsen  COVID-19 Education: The signs and symptoms of COVID-19 were discussed with the patient and how to seek care for testing (follow up with PCP or arrange E-visit). The importance of social distancing was discussed today.  Time:   Today, I have spent 10 minutes with the patient with telehealth technology discussing the above problems.  Katy bramblett cma obtained patient history and vitals Medication Adjustments/Labs and Tests Ordered: Current medicines are reviewed at length with the patient today.  Concerns regarding medicines are outlined above.   Tests Ordered: No orders of the defined types were placed in this encounter.   Medication Changes: No orders of the defined types were placed in this encounter.   Follow Up:  In Person prn  Signed, Vickey Sages, PA-C  03/14/2020 3:16 PM    [facility]

## 2020-04-20 ENCOUNTER — Encounter: Payer: Self-pay | Admitting: Physician Assistant

## 2020-05-08 ENCOUNTER — Other Ambulatory Visit: Payer: Self-pay | Admitting: Physician Assistant

## 2020-05-16 ENCOUNTER — Other Ambulatory Visit: Payer: Self-pay | Admitting: Physician Assistant

## 2020-05-30 ENCOUNTER — Ambulatory Visit: Payer: Commercial Managed Care - PPO | Admitting: Physician Assistant

## 2020-05-31 LAB — HM MAMMOGRAPHY

## 2020-06-07 ENCOUNTER — Encounter: Payer: Self-pay | Admitting: Physician Assistant

## 2020-06-09 ENCOUNTER — Other Ambulatory Visit: Payer: Self-pay

## 2020-06-09 ENCOUNTER — Ambulatory Visit: Payer: Managed Care, Other (non HMO) | Admitting: Physician Assistant

## 2020-06-09 ENCOUNTER — Encounter: Payer: Self-pay | Admitting: Physician Assistant

## 2020-06-09 VITALS — BP 116/76 | HR 79 | Temp 96.4°F | Ht 67.0 in | Wt 162.6 lb

## 2020-06-09 DIAGNOSIS — F419 Anxiety disorder, unspecified: Secondary | ICD-10-CM

## 2020-06-09 DIAGNOSIS — E782 Mixed hyperlipidemia: Secondary | ICD-10-CM

## 2020-06-09 MED ORDER — GABAPENTIN 100 MG PO CAPS: 100.0000 mg | ORAL_CAPSULE | Freq: Two times a day (BID) | ORAL | 1 refills | Status: DC

## 2020-06-09 NOTE — Progress Notes (Signed)
Established Patient Office Visit  Subjective:  Patient ID: Jeanne Jacobson, female    DOB: 1970/09/25  Age: 50 y.o. MRN: [id number]  CC:  Chief Complaint  Patient presents with  . Hyperlipidemia    58M follow up    HPI Jeanne Jacobson presents for follow up hyperlipidemia  Mixed hyperlipidemia  Pt presents with hyperlipidemia.  Compliance with treatment has been good; The patient is compliant with medications, maintains a low cholesterol diet , follows up as directed.  The patient denies experiencing any hypercholesterolemia related symptoms.  Pt currently on crestor 10mg  qd - last labwork 6 months ago and was normal  Pt with history of anxiety - states symptoms controlled well with ativan 0.5mg  - she takes nightly    Past Medical History:  Diagnosis Date  . Anxiety   . Hyperlipidemia     Past Surgical History:  Procedure Laterality Date  . ABDOMINAL HYSTERECTOMY    . TUBAL LIGATION      Family History  Problem Relation Age of Onset  . Coronary artery disease Father     Social History   Socioeconomic History  . Marital status: Married    Spouse name: Not on file  . Number of children: 2  . Years of education: Not on file  . Highest education level: Not on file  Occupational History  . Occupation: [employer]  Tobacco Use  . Smoking status: Never Smoker  . Smokeless tobacco: Never Used  Vaping Use  . Vaping Use: Never used  Substance and Sexual Activity  . Alcohol use: Yes    Comment: social  . Drug use: Never  . Sexual activity: Not on file  Other Topics Concern  . Not on file  Social History Narrative  . Not on file   Social Determinants of Health   Financial Resource Strain: Not on file  Food Insecurity: Not on file  Transportation Needs: Not on file  Physical Activity: Not on file  Stress: Not on file  Social Connections: Not on file  Intimate Partner Violence: Not on file     Current Outpatient Medications:  .  estradiol (ESTRACE) 2 MG  tablet, TAKE 1 TABLET DAILY, Disp: 90 tablet, Rfl: 0 .  fluticasone (FLONASE) 50 MCG/ACT nasal spray, Place 2 sprays into both nostrils daily., Disp: 16 g, Rfl: 6 .  LORazepam (ATIVAN) 0.5 MG tablet, TAKE 1 TABLET BY MOUTH EVERY DAY AS NEEDED, Disp: 30 tablet, Rfl: 1 .  meloxicam (MOBIC) 7.5 MG tablet, Take 7.5 mg by mouth daily., Disp: , Rfl:  .  rosuvastatin (CRESTOR) 10 MG tablet, TAKE 1 TABLET DAILY, Disp: 90 tablet, Rfl: 3 .  gabapentin (NEURONTIN) 100 MG capsule, Take 1 capsule (100 mg total) by mouth 2 (two) times daily., Disp: 180 capsule, Rfl: 1   Allergies  Allergen Reactions  . Penicillins     ROS CONSTITUTIONAL: Negative for chills, fatigue, fever, unintentional weight gain and unintentional weight loss.  E/N/T: Negative for ear pain, nasal congestion and sore throat.  CARDIOVASCULAR: Negative for chest pain, dizziness, palpitations and pedal edema.  RESPIRATORY: Negative for recent cough and dyspnea.  GASTROINTESTINAL: Negative for abdominal pain, acid reflux symptoms, constipation, diarrhea, nausea and vomiting.  MSK: Negative for arthralgias and myalgias.  INTEGUMENTARY: Negative for rash.  NEUROLOGICAL: Negative for dizziness and headaches.  PSYCHIATRIC: Negative for sleep disturbance and to question depression screen.  Negative for depression, negative for anhedonia.         Objective:    PHYSICAL EXAM:  VS: BP 116/76 (BP Location: Left Arm, Patient Position: Sitting, Cuff Size: Normal)   Pulse 79   Temp (!) 96.4 F (35.8 C) (Temporal)   Ht 5\' 7"  (1.702 m)   Wt 162 lb 9.6 oz (73.8 kg)   SpO2 99%   BMI 25.47 kg/m  PHYSICAL EXAM:   VS: BP 116/76 (BP Location: Left Arm, Patient Position: Sitting, Cuff Size: Normal)   Pulse 79   Temp (!) 96.4 F (35.8 C) (Temporal)   Ht 5\' 7"  (1.702 m)   Wt 162 lb 9.6 oz (73.8 kg)   SpO2 99%   BMI 25.47 kg/m   GEN: Well nourished, well developed, in no acute distress  Cardiac: RRR; no murmurs, rubs, or gallops,no  edema -  Respiratory:  normal respiratory rate and pattern with no distress - normal breath sounds with no rales, rhonchi, wheezes or rubs Psych: euthymic mood, appropriate affect and demeanor   BP 116/76 (BP Location: Left Arm, Patient Position: Sitting, Cuff Size: Normal)   Pulse 79   Temp (!) 96.4 F (35.8 C) (Temporal)   Ht 5\' 7"  (1.702 m)   Wt 162 lb 9.6 oz (73.8 kg)   SpO2 99%   BMI 25.47 kg/m  Wt Readings from Last 3 Encounters:  06/09/20 162 lb 9.6 oz (73.8 kg)  03/14/20 160 lb (72.6 kg)  11/29/19 159 lb (72.1 kg)     Health Maintenance Due  Topic Date Due  . COLONOSCOPY (Pts 45-51yrs Insurance coverage will need to be confirmed)  Never done  . COVID-19 Vaccine (3 - Booster for Moderna series) 02/12/2020    There are no preventive care reminders to display for this patient.  Lab Results  Component Value Date   TSH 2.920 11/29/2019   Lab Results  Component Value Date   WBC 4.4 11/29/2019   HGB 12.5 11/29/2019   HCT 36.7 11/29/2019   MCV 90 11/29/2019   PLT 207 11/29/2019   Lab Results  Component Value Date   NA 139 11/29/2019   K 4.4 11/29/2019   CO2 23 11/29/2019   GLUCOSE 96 11/29/2019   BUN 12 11/29/2019   CREATININE 0.78 11/29/2019   BILITOT 0.3 11/29/2019   ALKPHOS 54 11/29/2019   AST 11 11/29/2019   ALT 9 11/29/2019   PROT 6.8 11/29/2019   ALBUMIN 4.2 11/29/2019   CALCIUM 9.2 11/29/2019   Lab Results  Component Value Date   CHOL 153 11/29/2019   Lab Results  Component Value Date   HDL 58 11/29/2019   Lab Results  Component Value Date   LDLCALC 72 11/29/2019   Lab Results  Component Value Date   TRIG 131 11/29/2019   Lab Results  Component Value Date   CHOLHDL 2.6 11/29/2019   No results found for: HGBA1C    Assessment & Plan:   Problem List Items Addressed This Visit      Other   Mixed hyperlipidemia - Primary   Relevant Orders   CBC with Differential/Platelet   Comprehensive metabolic panel   Lipid panel    Anxiety Continue meds as directed     conti Meds ordered this encounter  Medications  . gabapentin (NEURONTIN) 100 MG capsule    Sig: Take 1 capsule (100 mg total) by mouth 2 (two) times daily.    Dispense:  180 capsule    Refill:  1    Order Specific Question:   Supervising Provider    Answer09/22/2021    Follow-up:  Return in about 6 months (around 12/09/2020) for fasting physical.    SARA R Jamus Loving, PA-C

## 2020-06-10 LAB — COMPREHENSIVE METABOLIC PANEL
ALT: 10 IU/L (ref 0–32)
AST: 12 IU/L (ref 0–40)
Albumin/Globulin Ratio: 1.9 (ref 1.2–2.2)
Albumin: 4.4 g/dL (ref 3.8–4.8)
Alkaline Phosphatase: 52 IU/L (ref 44–121)
BUN/Creatinine Ratio: 21 (ref 9–23)
BUN: 16 mg/dL (ref 6–24)
Bilirubin Total: 0.3 mg/dL (ref 0.0–1.2)
CO2: 22 mmol/L (ref 20–29)
Calcium: 9.3 mg/dL (ref 8.7–10.2)
Chloride: 101 mmol/L (ref 96–106)
Creatinine, Ser: 0.77 mg/dL (ref 0.57–1.00)
Globulin, Total: 2.3 g/dL (ref 1.5–4.5)
Glucose: 93 mg/dL (ref 65–99)
Potassium: 5.1 mmol/L (ref 3.5–5.2)
Sodium: 139 mmol/L (ref 134–144)
Total Protein: 6.7 g/dL (ref 6.0–8.5)
eGFR: 94 mL/min/{1.73_m2} (ref >59)

## 2020-06-10 LAB — CBC WITH DIFFERENTIAL/PLATELET
Basophils Absolute: 0 10*3/uL (ref 0.0–0.2)
Basos: 0 %
EOS (ABSOLUTE): 0.1 10*3/uL (ref 0.0–0.4)
Eos: 2 %
Hematocrit: 36.8 % (ref 34.0–46.6)
Hemoglobin: 12.7 g/dL (ref 11.1–15.9)
Immature Grans (Abs): 0 10*3/uL (ref 0.0–0.1)
Immature Granulocytes: 0 %
Lymphocytes Absolute: 2.2 10*3/uL (ref 0.7–3.1)
Lymphs: 47 %
MCH: 31 pg (ref 26.6–33.0)
MCHC: 34.5 g/dL (ref 31.5–35.7)
MCV: 90 fL (ref 79–97)
Monocytes Absolute: 0.4 10*3/uL (ref 0.1–0.9)
Monocytes: 9 %
Neutrophils Absolute: 2 10*3/uL (ref 1.4–7.0)
Neutrophils: 42 %
Platelets: 195 10*3/uL (ref 150–450)
RBC: 4.1 x10E6/uL (ref 3.77–5.28)
RDW: 11.8 % (ref 11.7–15.4)
WBC: 4.7 10*3/uL (ref 3.4–10.8)

## 2020-06-10 LAB — LIPID PANEL
Chol/HDL Ratio: 2.4 ratio (ref 0.0–4.4)
Cholesterol, Total: 154 mg/dL (ref 100–199)
HDL: 63 mg/dL (ref >39)
LDL Chol Calc (NIH): 70 mg/dL (ref 0–99)
Triglycerides: 122 mg/dL (ref 0–149)
VLDL Cholesterol Cal: 21 mg/dL (ref 5–40)

## 2020-06-10 LAB — CARDIOVASCULAR RISK ASSESSMENT

## 2020-07-18 ENCOUNTER — Other Ambulatory Visit: Payer: Self-pay | Admitting: Family Medicine

## 2020-08-07 ENCOUNTER — Other Ambulatory Visit: Payer: Self-pay | Admitting: Physician Assistant

## 2020-08-08 ENCOUNTER — Encounter: Payer: Self-pay | Admitting: Physician Assistant

## 2020-08-08 ENCOUNTER — Ambulatory Visit: Payer: Managed Care, Other (non HMO) | Admitting: Physician Assistant

## 2020-08-08 ENCOUNTER — Other Ambulatory Visit: Payer: Self-pay

## 2020-08-08 VITALS — BP 114/68 | HR 74 | Temp 97.3°F | Ht 67.0 in | Wt 160.0 lb

## 2020-08-08 DIAGNOSIS — F419 Anxiety disorder, unspecified: Secondary | ICD-10-CM

## 2020-08-08 MED ORDER — CITALOPRAM HYDROBROMIDE 10 MG PO TABS: 10.0000 mg | ORAL_TABLET | Freq: Every day | ORAL | 1 refills | Status: DC

## 2020-08-08 NOTE — Progress Notes (Signed)
Subjective:  Patient ID: Jeanne Jacobson, female    DOB: August 14, 1970  Age: 50 y.o. MRN: [id number]  Chief Complaint  Patient presents with  . Anxiety    HPI  pt in today to discuss anxiety - she states she takes ativan qd for anxiety but over the past several weeks her anxiety has worsened to a point where she is bothered daily with worrying and feeling anxious At times feels like she is 'blue' and really doesn't want to get out and do things Has not been on previous med besides ativan for anxiety Current Outpatient Medications on File Prior to Visit  Medication Sig Dispense Refill  . estradiol (ESTRACE) 2 MG tablet TAKE 1 TABLET DAILY 90 tablet 0  . fluticasone (FLONASE) 50 MCG/ACT nasal spray Place 2 sprays into both nostrils daily. 16 g 6  . gabapentin (NEURONTIN) 100 MG capsule Take 1 capsule (100 mg total) by mouth 2 (two) times daily. 180 capsule 1  . LORazepam (ATIVAN) 0.5 MG tablet TAKE 1 TABLET BY MOUTH EVERY DAY AS NEEDED 30 tablet 0  . meloxicam (MOBIC) 7.5 MG tablet Take 7.5 mg by mouth daily.    . rosuvastatin (CRESTOR) 10 MG tablet TAKE 1 TABLET DAILY 90 tablet 3   No current facility-administered medications on file prior to visit.   Past Medical History:  Diagnosis Date  . Anxiety   . Hyperlipidemia    Past Surgical History:  Procedure Laterality Date  . ABDOMINAL HYSTERECTOMY    . TUBAL LIGATION      Family History  Problem Relation Age of Onset  . Coronary artery disease Father    Social History   Socioeconomic History  . Marital status: Married    Spouse name: Not on file  . Number of children: 2  . Years of education: Not on file  . Highest education level: Not on file  Occupational History  . Occupation: [employer]  Tobacco Use  . Smoking status: Never Smoker  . Smokeless tobacco: Never Used  Vaping Use  . Vaping Use: Never used  Substance and Sexual Activity  . Alcohol use: Yes    Comment: social  . Drug use: Never  . Sexual activity: Not  on file  Other Topics Concern  . Not on file  Social History Narrative  . Not on file   Social Determinants of Health   Financial Resource Strain: Not on file  Food Insecurity: Not on file  Transportation Needs: Not on file  Physical Activity: Not on file  Stress: Not on file  Social Connections: Not on file    Review of Systems CONSTITUTIONAL: Negative for chills, fatigue, fever, unintentional weight gain and unintentional weight loss.   CARDIOVASCULAR: Negative for chest pain, dizziness, palpitations and pedal edema.  RESPIRATORY: Negative for recent cough and dyspnea.   PSYCHIATRIC:see HPI      Objective:  BP 114/68 (BP Location: Left Arm, Patient Position: Sitting, Cuff Size: Normal)   Pulse 74   Temp (!) 97.3 F (36.3 C) (Temporal)   Ht 5\' 7"  (1.702 m)   Wt 160 lb (72.6 kg)   SpO2 96%   BMI 25.06 kg/m   BP/Weight 08/08/2020 06/09/2020 03/14/2020  Systolic BP 114 116 -  Diastolic BP 68 76 -  Wt. (Lbs) 160 162.6 160  BMI 25.06 25.47 25.06    Physical Exam PHYSICAL EXAM:   VS: BP 114/68 (BP Location: Left Arm, Patient Position: Sitting, Cuff Size: Normal)   Pulse 74   Temp 05/12/2020)  97.3 F (36.3 C) (Temporal)   Ht 5\' 7"  (1.702 m)   Wt 160 lb (72.6 kg)   SpO2 96%   BMI 25.06 kg/m   GEN: Well nourished, well developed, in no acute distress   Cardiac: RRR; no murmurs, rubs, or gallops, Respiratory:  normal respiratory rate and pattern with no distress - normal breath sounds with no rales, rhonchi, wheezes or rubs  Psych: euthymic mood, appropriate affect and demeanor  Diabetic Foot Exam - Simple   No data filed      Lab Results  Component Value Date   WBC 4.7 06/09/2020   HGB 12.7 06/09/2020   HCT 36.8 06/09/2020   PLT 195 06/09/2020   GLUCOSE 93 06/09/2020   CHOL 154 06/09/2020   TRIG 122 06/09/2020   HDL 63 06/09/2020   LDLCALC 70 06/09/2020   ALT 10 06/09/2020   AST 12 06/09/2020   NA 139 06/09/2020   K 5.1 06/09/2020   CL 101 06/09/2020    CREATININE 0.77 06/09/2020   BUN 16 06/09/2020   CO2 22 06/09/2020   TSH 2.920 11/29/2019      Assessment & Plan:   1. Anxiety - citalopram (CELEXA) 10 MG tablet; Take 1 tablet (10 mg total) by mouth daily.  Dispense: 30 tablet; Refill: 1  call in 3 weeks and notify how med working Follow up as previously scheduled for chronic visit  Meds ordered this encounter  Medications  . citalopram (CELEXA) 10 MG tablet    Sig: Take 1 tablet (10 mg total) by mouth daily.    Dispense:  30 tablet    Refill:  1    Order Specific Question:   Supervising Provider    Answer:   12/01/2019    No orders of the defined types were placed in this encounter.    Follow-up: Return for as already scheduled.  An After Visit Summary was printed and given to the patient.  [facility] [phone]

## 2020-08-10 ENCOUNTER — Telehealth: Payer: Self-pay

## 2020-08-10 NOTE — Telephone Encounter (Signed)
Called pt. Pt VU.   Lorita Officer, [place] 08/10/20 12:08 PM

## 2020-08-10 NOTE — Telephone Encounter (Signed)
Unsure if all those symptoms would be caused by the medication - recommend rest, fluids When she is feeling better then to retry medication at 1/2 tablet

## 2020-08-10 NOTE — Telephone Encounter (Signed)
Pt calling and states she took citalopram. States it made her feel weird. She could not sleep, shaky, weak, thirsty, and diarrhea this morning. Pt states symptoms started right after going to bed and she took medicine before going to bed. Estimates 30 minutes after. Still felt bad until 4 am this morning.   Lorita Officer, CCMA 08/10/20 10:02 AM

## 2020-08-22 ENCOUNTER — Ambulatory Visit: Payer: Managed Care, Other (non HMO) | Admitting: Nurse Practitioner

## 2020-08-22 ENCOUNTER — Encounter: Payer: Self-pay | Admitting: Nurse Practitioner

## 2020-08-22 VITALS — BP 148/78 | HR 75 | Temp 97.6°F | Ht 67.0 in | Wt 159.0 lb

## 2020-08-22 DIAGNOSIS — J018 Other acute sinusitis: Secondary | ICD-10-CM | POA: Diagnosis not present

## 2020-08-22 DIAGNOSIS — J029 Acute pharyngitis, unspecified: Secondary | ICD-10-CM

## 2020-08-22 DIAGNOSIS — R5383 Other fatigue: Secondary | ICD-10-CM | POA: Diagnosis not present

## 2020-08-22 DIAGNOSIS — H6123 Impacted cerumen, bilateral: Secondary | ICD-10-CM

## 2020-08-22 LAB — POCT RAPID STREP A (OFFICE): Rapid Strep A Screen: NEGATIVE

## 2020-08-22 LAB — POC COVID19 BINAXNOW: SARS Coronavirus 2 Ag: NEGATIVE

## 2020-08-22 MED ORDER — AZITHROMYCIN 250 MG PO TABS: ORAL_TABLET | ORAL | 0 refills | Status: AC

## 2020-08-22 NOTE — Patient Instructions (Addendum)
Take Z-pack as prescribed Rest and push fluids We will call you with lab results Follow-up as needed  Sinusitis, Adult Sinusitis is soreness and swelling (inflammation) of your sinuses. Sinuses are hollow spaces in the bones around your face. They are located: Around your eyes. In the middle of your forehead. Behind your nose. In your cheekbones. Your sinuses and nasal passages are lined with a fluid called mucus. Mucus drains out of your sinuses. Swelling can trap mucus in your sinuses. This lets germs (bacteria, virus, or fungus) grow, which leads to infection. Most of the time, this condition is caused bya virus. What are the causes? This condition is caused by: Allergies. Asthma. Germs. Things that block your nose or sinuses. Growths in the nose (nasal polyps). Chemicals or irritants in the air. Fungus (rare). What increases the risk? You are more likely to develop this condition if: You have a weak body defense system (immune system). You do a lot of swimming or diving. You use nasal sprays too much. You smoke. What are the signs or symptoms? The main symptoms of this condition are pain and a feeling of pressure around the sinuses. Other symptoms include: Stuffy nose (congestion). Runny nose (drainage). Swelling and warmth in the sinuses. Headache. Toothache. A cough that may get worse at night. Mucus that collects in the throat or the back of the nose (postnasal drip). Being unable to smell and taste. Being very tired (fatigue). A fever. Sore throat. Bad breath. How is this diagnosed? This condition is diagnosed based on: Your symptoms. Your medical history. A physical exam. Tests to find out if your condition is short-term (acute) or long-term (chronic). Your doctor may: Check your nose for growths (polyps). Check your sinuses using a tool that has a light (endoscope). Check for allergies or germs. Do imaging tests, such as an MRI or CT scan. How is this  treated? Treatment for this condition depends on the cause and whether it is short-term or long-term. If caused by a virus, your symptoms should go away on their own within 10 days. You may be given medicines to relieve symptoms. They include: Medicines that shrink swollen tissue in the nose. Medicines that treat allergies (antihistamines). A spray that treats swelling of the nostrils.  Rinses that help get rid of thick mucus in your nose (nasal saline washes). If caused by bacteria, your doctor may wait to see if you will get better without treatment. You may be given antibiotic medicine if you have: A very bad infection. A weak body defense system. If caused by growths in the nose, you may need to have surgery. Follow these instructions at home: Medicines Take, use, or apply over-the-counter and prescription medicines only as told by your doctor. These may include nasal sprays. If you were prescribed an antibiotic medicine, take it as told by your doctor. Do not stop taking the antibiotic even if you start to feel better. Hydrate and humidify  Drink enough water to keep your pee (urine) pale yellow. Use a cool mist humidifier to keep the humidity level in your home above 50%. Breathe in steam for 10-15 minutes, 3-4 times a day, or as told by your doctor. You can do this in the bathroom while a hot shower is running. Try not to spend time in cool or dry air.  Rest Rest as much as you can. Sleep with your head raised (elevated). Make sure you get enough sleep each night. General instructions  Put a warm, moist washcloth on  your face 3-4 times a day, or as often as told by your doctor. This will help with discomfort. Wash your hands often with soap and water. If there is no soap and water, use hand sanitizer. Do not smoke. Avoid being around people who are smoking (secondhand smoke). Keep all follow-up visits as told by your doctor. This is important.  Contact a doctor if: You have a  fever. Your symptoms get worse. Your symptoms do not get better within 10 days. Get help right away if: You have a very bad headache. You cannot stop throwing up (vomiting). You have very bad pain or swelling around your face or eyes. You have trouble seeing. You feel confused. Your neck is stiff. You have trouble breathing. Summary Sinusitis is swelling of your sinuses. Sinuses are hollow spaces in the bones around your face. This condition is caused by tissues in your nose that become inflamed or swollen. This traps germs. These can lead to infection. If you were prescribed an antibiotic medicine, take it as told by your doctor. Do not stop taking it even if you start to feel better. Keep all follow-up visits as told by your doctor. This is important. This information is not intended to replace advice given to you by your health care provider. Make sure you discuss any questions you have with your healthcare provider. Document Revised: 07/28/2017 Document Reviewed: 07/28/2017 Elsevier Patient Education  2022 [employer].

## 2020-08-22 NOTE — Progress Notes (Signed)
Acute Office Visit  Subjective:    Patient ID: Jeanne Jacobson, female    DOB: 1970/06/16, 50 y.o.   MRN: [id number]  Chief Complaint  Patient presents with   Cough         Jeanne Jacobson is a 50 year old Caucasian female that is in today for sore throat, post-nasal-drip, and cough. Onset of symptoms was 5-days ago.Treatment has included Mucinex and Delsym cough syrup. She denies known exposure to ill-contacts. She has obtained two COVID-19 vaccines.  Jeanne Jacobson tells me that she has experienced fatigue and "fogginess" for several weeks. She recently began Citalopram 10 mg for anxiety per PCP on 08/08/20. She states her daughter recently was married. Planning the wedding was stressful and anxiety provoking for Jeanne Jacobson. She states she is uncertain if fatigue and fogginess are related to stress and anxiety or side effects from Citalopram.    Past Medical History:  Diagnosis Date   Anxiety    Hyperlipidemia     Past Surgical History:  Procedure Laterality Date   ABDOMINAL HYSTERECTOMY     TUBAL LIGATION      Family History  Problem Relation Age of Onset   Coronary artery disease Father     Social History   Socioeconomic History   Marital status: Married    Spouse name: Not on file   Number of children: 2   Years of education: Not on file   Highest education level: Not on file  Occupational History   Occupation: Tax adviser  Tobacco Use   Smoking status: Never   Smokeless tobacco: Never  Vaping Use   Vaping Use: Never used  Substance and Sexual Activity   Alcohol use: Yes    Comment: social   Drug use: Never   Sexual activity: Not on file  Other Topics Concern   Not on file  Social History Narrative   Not on file   Social Determinants of Health   Financial Resource Strain: Not on file  Food Insecurity: Not on file  Transportation Needs: Not on file  Physical Activity: Not on file  Stress: Not on file  Social Connections: Not on file  Intimate Partner Violence:  Not on file    Outpatient Medications Prior to Visit  Medication Sig Dispense Refill   citalopram (CELEXA) 10 MG tablet Take 1 tablet (10 mg total) by mouth daily. 30 tablet 1   estradiol (ESTRACE) 2 MG tablet TAKE 1 TABLET DAILY 90 tablet 0   fluticasone (FLONASE) 50 MCG/ACT nasal spray Place 2 sprays into both nostrils daily. 16 g 6   gabapentin (NEURONTIN) 100 MG capsule Take 1 capsule (100 mg total) by mouth 2 (two) times daily. 180 capsule 1   LORazepam (ATIVAN) 0.5 MG tablet TAKE 1 TABLET BY MOUTH EVERY DAY AS NEEDED 30 tablet 0   meloxicam (MOBIC) 7.5 MG tablet Take 7.5 mg by mouth daily.     rosuvastatin (CRESTOR) 10 MG tablet TAKE 1 TABLET DAILY 90 tablet 3   No facility-administered medications prior to visit.    Allergies  Allergen Reactions   Penicillins     Review of Systems  Constitutional:  Positive for fatigue. Negative for fever.  HENT:  Positive for congestion, postnasal drip and sore throat. Negative for ear pain and sinus pressure.   Eyes:  Negative for pain.  Respiratory:  Positive for cough. Negative for chest tightness, shortness of breath and wheezing.   Cardiovascular:  Negative for chest pain and palpitations.  Gastrointestinal:  Negative for abdominal pain, constipation, diarrhea,  nausea and vomiting.  Endocrine: Negative.   Genitourinary:  Negative for dysuria and hematuria.  Musculoskeletal:  Negative for arthralgias, back pain, joint swelling and myalgias.  Skin:  Negative for rash.  Allergic/Immunologic: Negative for environmental allergies.  Neurological:  Positive for headaches. Negative for dizziness and weakness.  Psychiatric/Behavioral:  Positive for decreased concentration. Negative for dysphoric mood. The patient is not nervous/anxious.       Objective:    Physical Exam Vitals reviewed.  Constitutional:      Appearance: Normal appearance.  HENT:     Right Ear: There is impacted cerumen.     Left Ear: There is impacted cerumen.      Nose: Congestion and rhinorrhea present.     Mouth/Throat:     Mouth: Mucous membranes are dry.     Pharynx: Posterior oropharyngeal erythema present.  Cardiovascular:     Rate and Rhythm: Normal rate and regular rhythm.     Pulses: Normal pulses.     Heart sounds: Normal heart sounds.  Pulmonary:     Effort: Pulmonary effort is normal.     Breath sounds: Normal breath sounds.  Abdominal:     General: Bowel sounds are normal.     Palpations: Abdomen is soft.  Musculoskeletal:     Cervical back: Neck supple. No tenderness.  Lymphadenopathy:     Cervical: No cervical adenopathy.  Skin:    General: Skin is warm and dry.     Capillary Refill: Capillary refill takes less than 2 seconds.  Neurological:     General: No focal deficit present.     Mental Status: She is alert and oriented to person, place, and time.  Psychiatric:        Mood and Affect: Mood normal.        Behavior: Behavior normal.    BP (!) 148/78 (BP Location: Left Arm, Patient Position: Sitting)   Pulse 75   Temp 97.6 F (36.4 C) (Temporal)   Ht 5' 7"  (1.702 m)   Wt 159 lb (72.1 kg)   SpO2 98%   BMI 24.90 kg/m  Wt Readings from Last 3 Encounters:  08/22/20 159 lb (72.1 kg)  08/08/20 160 lb (72.6 kg)  06/09/20 162 lb 9.6 oz (73.8 kg)    Health Maintenance Due  Topic Date Due   COLONOSCOPY (Pts 45-79yr Insurance coverage will need to be confirmed)  Never done   Zoster Vaccines- Shingrix (1 of 2) Never done   COVID-19 Vaccine (3 - Booster for Moderna series) 01/13/2020    Lab Results  Component Value Date   TSH 2.920 11/29/2019   Lab Results  Component Value Date   WBC 4.7 06/09/2020   HGB 12.7 06/09/2020   HCT 36.8 06/09/2020   MCV 90 06/09/2020   PLT 195 06/09/2020   Lab Results  Component Value Date   NA 139 06/09/2020   K 5.1 06/09/2020   CO2 22 06/09/2020   GLUCOSE 93 06/09/2020   BUN 16 06/09/2020   CREATININE 0.77 06/09/2020   BILITOT 0.3 06/09/2020   ALKPHOS 52 06/09/2020   AST  12 06/09/2020   ALT 10 06/09/2020   PROT 6.7 06/09/2020   ALBUMIN 4.4 06/09/2020   CALCIUM 9.3 06/09/2020   EGFR 94 06/09/2020   Lab Results  Component Value Date   CHOL 154 06/09/2020   Lab Results  Component Value Date   HDL 63 06/09/2020   Lab Results  Component Value Date   LDLCALC 70 06/09/2020   Lab  Results  Component Value Date   TRIG 122 06/09/2020   Lab Results  Component Value Date   CHOLHDL 2.4 06/09/2020      Assessment & Plan:   1. Acute non-recurrent sinusitis of other sinus - azithromycin (ZITHROMAX) 250 MG tablet; Take 2 tablets on day 1, then 1 tablet daily on days 2 through 5  Dispense: 6 tablet; Refill: 0  2. Bilateral impacted cerumen - EAR CERUMEN REMOVAL -Debrox as needed for bilateral cerumen  3. Sore throat - POC COVID-19 BinaxNow-Negative - POCT rapid strep A--Negative  4. Other fatigue - Vitamin D, 25-hydroxy - B12 and Folate Panel    Take Z-pack as prescribed Rest and push fluids We will call you with lab results Continue Mucinex and Delsym Use Debrox ear wax drops to soften ear wax as needed Follow-up as needed   I,Lauren M Auman,acting as a scribe for [employer], NP.,have documented all relevant documentation on the behalf of Rip Harbour, NP,as directed by  Rip Harbour, NP while in the presence of Rip Harbour, NP.   I, Rip Harbour, NP, have reviewed all documentation for this visit. The documentation on 08/22/20 for the exam, diagnosis, procedures, and orders are all accurate and complete.    Follow-up: As needed   Signed,  Jerrell Belfast, DNP

## 2020-08-23 LAB — VITAMIN D 25 HYDROXY (VIT D DEFICIENCY, FRACTURES): Vit D, 25-Hydroxy: 34.4 ng/mL (ref 30.0–100.0)

## 2020-08-23 LAB — B12 AND FOLATE PANEL
Folate: 10.7 ng/mL (ref >3.0)
Vitamin B-12: 368 pg/mL (ref 232–1245)

## 2020-10-03 ENCOUNTER — Other Ambulatory Visit: Payer: Self-pay | Admitting: Physician Assistant

## 2020-11-06 ENCOUNTER — Other Ambulatory Visit: Payer: Self-pay | Admitting: Physician Assistant

## 2020-12-13 ENCOUNTER — Encounter: Payer: Managed Care, Other (non HMO) | Admitting: Physician Assistant

## 2020-12-15 ENCOUNTER — Other Ambulatory Visit: Payer: Managed Care, Other (non HMO)

## 2020-12-19 ENCOUNTER — Encounter: Payer: Managed Care, Other (non HMO) | Admitting: Physician Assistant

## 2021-01-26 ENCOUNTER — Other Ambulatory Visit: Payer: Self-pay | Admitting: Physician Assistant

## 2021-02-05 ENCOUNTER — Other Ambulatory Visit: Payer: Self-pay | Admitting: Physician Assistant

## 2021-02-12 ENCOUNTER — Other Ambulatory Visit: Payer: Self-pay | Admitting: Physician Assistant

## 2021-02-12 ENCOUNTER — Other Ambulatory Visit: Payer: Managed Care, Other (non HMO)

## 2021-02-12 DIAGNOSIS — F419 Anxiety disorder, unspecified: Secondary | ICD-10-CM

## 2021-02-12 DIAGNOSIS — E782 Mixed hyperlipidemia: Secondary | ICD-10-CM

## 2021-02-13 LAB — COMPREHENSIVE METABOLIC PANEL
ALT: 11 IU/L (ref 0–32)
AST: 10 IU/L (ref 0–40)
Albumin/Globulin Ratio: 1.9 (ref 1.2–2.2)
Albumin: 4.3 g/dL (ref 3.8–4.9)
Alkaline Phosphatase: 53 IU/L (ref 44–121)
BUN/Creatinine Ratio: 20 (ref 9–23)
BUN: 16 mg/dL (ref 6–24)
Bilirubin Total: 0.3 mg/dL (ref 0.0–1.2)
CO2: 24 mmol/L (ref 20–29)
Calcium: 9.2 mg/dL (ref 8.7–10.2)
Chloride: 103 mmol/L (ref 96–106)
Creatinine, Ser: 0.81 mg/dL (ref 0.57–1.00)
Globulin, Total: 2.3 g/dL (ref 1.5–4.5)
Glucose: 91 mg/dL (ref 70–99)
Potassium: 4.5 mmol/L (ref 3.5–5.2)
Sodium: 139 mmol/L (ref 134–144)
Total Protein: 6.6 g/dL (ref 6.0–8.5)
eGFR: 88 mL/min/{1.73_m2} (ref >59)

## 2021-02-13 LAB — LIPID PANEL
Chol/HDL Ratio: 2.3 ratio (ref 0.0–4.4)
Cholesterol, Total: 156 mg/dL (ref 100–199)
HDL: 67 mg/dL (ref >39)
LDL Chol Calc (NIH): 66 mg/dL (ref 0–99)
Triglycerides: 137 mg/dL (ref 0–149)
VLDL Cholesterol Cal: 23 mg/dL (ref 5–40)

## 2021-02-13 LAB — CARDIOVASCULAR RISK ASSESSMENT

## 2021-02-13 LAB — CBC WITH DIFFERENTIAL/PLATELET
Basophils Absolute: 0 10*3/uL (ref 0.0–0.2)
Basos: 0 %
EOS (ABSOLUTE): 0.1 10*3/uL (ref 0.0–0.4)
Eos: 2 %
Hematocrit: 36.4 % (ref 34.0–46.6)
Hemoglobin: 12.2 g/dL (ref 11.1–15.9)
Immature Grans (Abs): 0 10*3/uL (ref 0.0–0.1)
Immature Granulocytes: 0 %
Lymphocytes Absolute: 2.3 10*3/uL (ref 0.7–3.1)
Lymphs: 48 %
MCH: 30.5 pg (ref 26.6–33.0)
MCHC: 33.5 g/dL (ref 31.5–35.7)
MCV: 91 fL (ref 79–97)
Monocytes Absolute: 0.4 10*3/uL (ref 0.1–0.9)
Monocytes: 8 %
Neutrophils Absolute: 2.1 10*3/uL (ref 1.4–7.0)
Neutrophils: 42 %
Platelets: 198 10*3/uL (ref 150–450)
RBC: 4 x10E6/uL (ref 3.77–5.28)
RDW: 12.3 % (ref 11.7–15.4)
WBC: 4.8 10*3/uL (ref 3.4–10.8)

## 2021-02-13 LAB — TSH: TSH: 2.39 u[IU]/mL (ref 0.450–4.500)

## 2021-02-15 ENCOUNTER — Encounter: Payer: Self-pay | Admitting: Physician Assistant

## 2021-02-15 ENCOUNTER — Ambulatory Visit (INDEPENDENT_AMBULATORY_CARE_PROVIDER_SITE_OTHER): Payer: Managed Care, Other (non HMO) | Admitting: Physician Assistant

## 2021-02-15 ENCOUNTER — Other Ambulatory Visit: Payer: Self-pay

## 2021-02-15 VITALS — BP 110/82 | HR 68 | Temp 97.3°F | Ht 67.0 in | Wt 163.8 lb

## 2021-02-15 DIAGNOSIS — R002 Palpitations: Secondary | ICD-10-CM

## 2021-02-15 DIAGNOSIS — R0789 Other chest pain: Secondary | ICD-10-CM | POA: Diagnosis not present

## 2021-02-15 DIAGNOSIS — L309 Dermatitis, unspecified: Secondary | ICD-10-CM | POA: Diagnosis not present

## 2021-02-15 LAB — TROPONIN T: Troponin T (Highly Sensitive): <6 ng/L (ref 0–14)

## 2021-02-15 MED ORDER — DESONIDE 0.05 % EX CREA: TOPICAL_CREAM | Freq: Two times a day (BID) | CUTANEOUS | 0 refills | Status: DC

## 2021-02-15 NOTE — Progress Notes (Signed)
Subjective:  Patient ID: Jeanne Jacobson, female    DOB: [birth year]  Age: 50 y.o. MRN: [id number]  Chief Complaint  Patient presents with   Chest Pain    HPI  Pt initially scheduled for wellness exam and had labwork done 3 days ago (normal cbc, cmp , tsh and lipid) however she is complaining of intermittent chest pains and palpitations She states she has had palpitations for about a year -  The chest pains began a few weeks ago - last episode yesterday States they feel 'quicky' and sharp lasting a few minutes and usually with activity Denies dyspnea or edema Does have family history of CAD  Pt states that for the past month she has been having right nipple itchiness and some dryness and flaking of the nipple itself - denies any masses - is up to date with her mammogram Current Outpatient Medications on File Prior to Visit  Medication Sig Dispense Refill   citalopram (CELEXA) 10 MG tablet Take 1 tablet (10 mg total) by mouth daily. 30 tablet 1   estradiol (ESTRACE) 2 MG tablet TAKE 1 TABLET DAILY 90 tablet 0   gabapentin (NEURONTIN) 100 MG capsule Take 1 capsule (100 mg total) by mouth 2 (two) times daily. 180 capsule 1   LORazepam (ATIVAN) 0.5 MG tablet TAKE 1 TABLET BY MOUTH EVERY DAY AS NEEDED 30 tablet 0   meloxicam (MOBIC) 7.5 MG tablet Take 7.5 mg by mouth daily.     rosuvastatin (CRESTOR) 10 MG tablet TAKE 1 TABLET DAILY 90 tablet 3   No current facility-administered medications on file prior to visit.   Past Medical History:  Diagnosis Date   Anxiety    Hyperlipidemia    Past Surgical History:  Procedure Laterality Date   ABDOMINAL HYSTERECTOMY     TUBAL LIGATION      Family History  Problem Relation Age of Onset   Coronary artery disease Father    Social History   Socioeconomic History   Marital status: Married    Spouse name: Not on file   Number of children: 2   Years of education: Not on file   Highest education level: Not on file  Occupational History    Occupation: Sports administrator  Tobacco Use   Smoking status: Never   Smokeless tobacco: Never  Vaping Use   Vaping Use: Never used  Substance and Sexual Activity   Alcohol use: Yes    Comment: social   Drug use: Never   Sexual activity: Not on file  Other Topics Concern   Not on file  Social History Narrative   Not on file   Social Determinants of Health   Financial Resource Strain: Not on file  Food Insecurity: Not on file  Transportation Needs: Not on file  Physical Activity: Not on file  Stress: Not on file  Social Connections: Not on file    Review of Systems  CONSTITUTIONAL: Negative for chills, fatigue, fever, unintentional weight gain and unintentional weight loss.  E/N/T: Negative for ear pain, nasal congestion and sore throat.  CARDIOVASCULAR: see HPI RESPIRATORY: Negative for recent cough and dyspnea.  GASTROINTESTINAL: Negative for abdominal pain, acid reflux symptoms, constipation, diarrhea, nausea and vomiting.  GU - see HPI MSK: Negative for arthralgias and myalgias.  INTEGUMENTARY: see HPI      Objective:  BP 110/82 (BP Location: Left Arm, Patient Position: Sitting, Cuff Size: Normal)   Pulse 68   Temp (!) 97.3 F (36.3 C) (Temporal)   Ht 5\' 7"  (  1.702 m)   Wt 163 lb 12.8 oz (74.3 kg)   SpO2 97%   BMI 25.65 kg/m   BP/Weight 02/15/2021 08/22/2020 08/08/2020  Systolic BP 110 148 114  Diastolic BP 82 78 68  Wt. (Lbs) 163.8 159 160  BMI 25.65 24.9 25.06    Physical Exam PHYSICAL EXAM:   VS: BP 110/82 (BP Location: Left Arm, Patient Position: Sitting, Cuff Size: Normal)   Pulse 68   Temp (!) 97.3 F (36.3 C) (Temporal)   Ht 5\' 7"  (1.702 m)   Wt 163 lb 12.8 oz (74.3 kg)   SpO2 97%   BMI 25.65 kg/m   GEN: Well nourished, well developed, in no acute distress  Cardiac: RRR; no murmurs, rubs, or gallops,no edema - no significant varicosities Respiratory:  normal respiratory rate and pattern with no distress - normal breath sounds with no rales,  rhonchi, wheezes or rubs GI: normal bowel sounds, no masses or tenderness MS: no deformity or atrophy  Skin: warm and dry, dry scaly rash at top of right nipple -  Neuro:  Alert and Oriented x 3, Strength and sensation are intact - CN II-Xii grossly intact Psych: euthymic mood, appropriate affect and demeanor  EKG normal Diabetic Foot Exam - Simple   No data filed      Lab Results  Component Value Date   WBC 4.8 02/12/2021   HGB 12.2 02/12/2021   HCT 36.4 02/12/2021   PLT 198 02/12/2021   GLUCOSE 91 02/12/2021   CHOL 156 02/12/2021   TRIG 137 02/12/2021   HDL 67 02/12/2021   LDLCALC 66 02/12/2021   ALT 11 02/12/2021   AST 10 02/12/2021   NA 139 02/12/2021   K 4.5 02/12/2021   CL 103 02/12/2021   CREATININE 0.81 02/12/2021   BUN 16 02/12/2021   CO2 24 02/12/2021   TSH 2.390 02/12/2021      Assessment & Plan:   Problem List Items Addressed This Visit   None Visit Diagnoses     Other chest pain    -  Primary   Relevant Orders   Troponin T   EKG 12-Lead (Completed)   Ambulatory referral to Cardiology   Palpitations       Relevant Orders   Ambulatory referral to Cardiology   Dermatitis       Relevant Medications   desonide (DESOWEN) 0.05 % cream     .  Meds ordered this encounter  Medications   desonide (DESOWEN) 0.05 % cream    Sig: Apply topically 2 (two) times daily.    Dispense:  30 g    Refill:  0    Order Specific Question:   Supervising Provider    Answer14/07/2020     Orders Placed This Encounter  Procedures   Troponin T   Ambulatory referral to Cardiology   EKG 12-Lead     Follow-up: Return in about 4 weeks (around 03/15/2021) for fasting physical.  An After Visit Summary was printed and given to the patient.  05/13/2021 [facility] [phone]

## 2021-03-21 ENCOUNTER — Other Ambulatory Visit: Payer: Self-pay

## 2021-03-21 ENCOUNTER — Ambulatory Visit (INDEPENDENT_AMBULATORY_CARE_PROVIDER_SITE_OTHER): Payer: Managed Care, Other (non HMO) | Admitting: Physician Assistant

## 2021-03-21 ENCOUNTER — Encounter: Payer: Self-pay | Admitting: Physician Assistant

## 2021-03-21 VITALS — BP 122/70 | HR 64 | Temp 97.6°F | Resp 15 | Ht 67.0 in | Wt 165.0 lb

## 2021-03-21 DIAGNOSIS — Z1211 Encounter for screening for malignant neoplasm of colon: Secondary | ICD-10-CM

## 2021-03-21 DIAGNOSIS — Z1231 Encounter for screening mammogram for malignant neoplasm of breast: Secondary | ICD-10-CM

## 2021-03-21 DIAGNOSIS — Z0000 Encounter for general adult medical examination without abnormal findings: Secondary | ICD-10-CM | POA: Diagnosis not present

## 2021-03-21 LAB — POCT URINALYSIS DIP (CLINITEK)
Bilirubin, UA: NEGATIVE
Blood, UA: NEGATIVE
Glucose, UA: NEGATIVE mg/dL
Ketones, POC UA: NEGATIVE mg/dL
Leukocytes, UA: NEGATIVE
Nitrite, UA: NEGATIVE
Spec Grav, UA: 1.015 (ref 1.010–1.025)
Urobilinogen, UA: 1 E.U./dL
pH, UA: 8 (ref 5.0–8.0)

## 2021-03-21 NOTE — Progress Notes (Signed)
Subjective:  Patient ID: Jeanne Jacobson, female    DOB: February 21, 1971  Age: 51 y.o. MRN: [id number]  Chief Complaint  Patient presents with   Annual Exam    HPI Well Adult Physical: Patient here for a comprehensive physical exam.The patient reports no problems Do you take any herbs or supplements that were not prescribed by a doctor? no Are you taking calcium supplements? no Are you taking aspirin daily? no  Encounter for general adult medical examination without abnormal findings  Physical ("At Risk" items are starred): Patient's last physical exam was 1 year ago .  Patient is not afflicted from Stress Incontinence and Urge Incontinence  Patient wears a seat belt, has smoke detectors, has carbon monoxide detectors, practices appropriate gun safety, and wears sunscreen with extended sun exposure. Dental Care: biannual cleanings, brushes and flosses daily. Ophthalmology/Optometry: Annual visit.  Hearing loss: none Vision impairments: wears glasses - due for appt  Pt is due for mammogram Pt would like to be scheduled for colonoscopy Pt has appt tomorrow with cardiology for follow up of palpitations  [place] Office Visit from 08/08/2020 in [place]  PHQ-2 Total Score 0               Social Hx   Social History   Socioeconomic History   Marital status: Married    Spouse name: Not on file   Number of children: 2   Years of education: Not on file   Highest education level: Not on file  Occupational History   Occupation: Tax adviser  Tobacco Use   Smoking status: Never   Smokeless tobacco: Never  Vaping Use   Vaping Use: Never used  Substance and Sexual Activity   Alcohol use: Yes    Comment: social   Drug use: Never   Sexual activity: Not on file  Other Topics Concern   Not on file  Social History Narrative   Not on file   Social Determinants of Health   Financial Resource Strain: Not on file  Food Insecurity: Not on file  Transportation Needs:  Not on file  Physical Activity: Not on file  Stress: Not on file  Social Connections: Not on file   Past Medical History:  Diagnosis Date   Anxiety    Hyperlipidemia    Past Surgical History:  Procedure Laterality Date   ABDOMINAL HYSTERECTOMY     TUBAL LIGATION      Family History  Problem Relation Age of Onset   Coronary artery disease Father     Review of Systems CONSTITUTIONAL: Negative for chills, fatigue, fever, unintentional weight gain and unintentional weight loss.  E/N/T: Negative for ear pain, nasal congestion and sore throat.  CARDIOVASCULAR: Negative for chest pain, dizziness, palpitations and pedal edema.  RESPIRATORY: Negative for recent cough and dyspnea.  GASTROINTESTINAL: Negative for abdominal pain, acid reflux symptoms, constipation, diarrhea, nausea and vomiting.  MSK: Negative for arthralgias and myalgias.  INTEGUMENTARY: Negative for rash.  NEUROLOGICAL: Negative for dizziness and headaches.  PSYCHIATRIC: Negative for sleep disturbance and to question depression screen.  Negative for depression, negative for anhedonia.       Objective:  BP 122/70    Pulse 64    Temp 97.6 F (36.4 C)    Resp 15    Ht 5\' 7"  (1.702 m)    Wt 165 lb (74.8 kg)    BMI 25.84 kg/m   BP/Weight 03/21/2021 02/15/2021 [id number]  Systolic BP [id number]  Diastolic BP 70 82 78  Wt. (Lbs) 165 163.8 159  BMI 25.84 25.65 24.9    Physical Exam PHYSICAL EXAM:   VS: BP 122/70    Pulse 64    Temp 97.6 F (36.4 C)    Resp 15    Ht 5\' 7"  (1.702 m)    Wt 165 lb (74.8 kg)    BMI 25.84 kg/m   GEN: Well nourished, well developed, in no acute distress  HEENT: normal external ears and nose - normal external auditory canals and TMS - - Lips, Teeth and Gums - normal  Oropharynx - normal mucosa, palate, and posterior pharynx Cardiac: RRR; no murmurs, rubs, or gallops,no edema - no significant varicosities Respiratory:  normal respiratory rate and pattern with no distress - normal breath  sounds with no rales, rhonchi, wheezes or rubs GI: normal bowel sounds, no masses or tenderness MS: no deformity or atrophy  Skin: warm and dry, no rash  Neuro:  Alert and Oriented x 3, Strength and sensation are intact - CN II-Xii grossly intact Psych: euthymic mood, appropriate affect and demeanor  Lab Results  Component Value Date   WBC 4.8 02/12/2021   HGB 12.2 02/12/2021   HCT 36.4 02/12/2021   PLT 198 02/12/2021   GLUCOSE 91 02/12/2021   CHOL 156 02/12/2021   TRIG 137 02/12/2021   HDL 67 02/12/2021   LDLCALC 66 02/12/2021   ALT 11 02/12/2021   AST 10 02/12/2021   NA 139 02/12/2021   K 4.5 02/12/2021   CL 103 02/12/2021   CREATININE 0.81 02/12/2021   BUN 16 02/12/2021   CO2 24 02/12/2021   TSH 2.390 02/12/2021      Assessment & Plan:   Problem List Items Addressed This Visit   None Visit Diagnoses     Health maintenance examination    -  Primary   Relevant Orders   POCT URINALYSIS DIP (CLINITEK) (Completed)   Flu Vaccine MDCK QUAD PF   Encounter for screening mammogram for breast cancer       Relevant Orders   MM DIGITAL SCREENING BILATERAL   Colon cancer screening       Relevant Orders   Ambulatory referral to Gastroenterology       Recent labwork done all stable  Body mass index is 25.84 kg/m.   These are the goals we discussed:  Goals   None      This is a list of the screening recommended for you and due dates:  Health Maintenance  Topic Date Due   Colon Cancer Screening  Never done   Flu Shot  10/09/2020   Zoster (Shingles) Vaccine (1 of 2) 06/19/2021*   COVID-19 Vaccine (3 - Booster for Moderna series) 04/06/2022*   Mammogram  05/31/2021   Tetanus Vaccine  11/01/2026   Pneumococcal Vaccination  Aged Out   HPV Vaccine  Aged Out   Pap Smear  Discontinued   Hepatitis C Screening: USPSTF Recommendation to screen - Ages 18-79 yo.  Discontinued   HIV Screening  Discontinued  *Topic was postponed. The date shown is not the original due  date.     No orders of the defined types were placed in this encounter.   Follow-up: Return in about 6 months (around 09/18/2021) for chronic fasting follow up.  An After Visit Summary was printed and given to the patient.  Yetta Flock [facility] [phone]

## 2021-03-22 ENCOUNTER — Ambulatory Visit: Payer: Managed Care, Other (non HMO) | Admitting: Cardiology

## 2021-03-22 ENCOUNTER — Ambulatory Visit (INDEPENDENT_AMBULATORY_CARE_PROVIDER_SITE_OTHER): Payer: Managed Care, Other (non HMO)

## 2021-03-22 ENCOUNTER — Encounter: Payer: Self-pay | Admitting: Cardiology

## 2021-03-22 DIAGNOSIS — R0609 Other forms of dyspnea: Secondary | ICD-10-CM

## 2021-03-22 DIAGNOSIS — R0789 Other chest pain: Secondary | ICD-10-CM

## 2021-03-22 DIAGNOSIS — R002 Palpitations: Secondary | ICD-10-CM

## 2021-03-22 DIAGNOSIS — E785 Hyperlipidemia, unspecified: Secondary | ICD-10-CM

## 2021-03-22 NOTE — Progress Notes (Signed)
Cardiology Consultation:    Date:  03/22/2021   ID:  Jeanne Jacobson, DOB [birth year], MRN [id number]  PCP:  Marianne Sofia, PA-C  Cardiologist:  Gypsy Balsam, MD   Referring MD: Marianne Sofia, PA-C   Chief Complaint  Patient presents with   Palpitations    Ongoing for month Occurs on a daily basis, sob when palpitations are intense    History of Present Illness:    Jeanne Jacobson is a 51 y.o. female who is being seen today for the evaluation of palpitations, atypical chest pain at the request of Marianne Sofia, PA-C.  Past medical history significant for dyslipidemia, family history of premature coronary artery disease.  She was referred to [place] because of episode of palpitations, dyspnea on exertion as well as chest pain.  She said the palpitation has been going for months she will feel her heart stopping forceful beating and sometimes some fluttering.  There is no dizziness passing out associated with this sensation there is no chest pain no shortness of breath no sweating.  It happens few times a week.  She also described to have some chest pain.  That is not related to palpitations.  She has difficulty describing pain she described on the left side of her chest lasting 2 up to half an hour taking deep breath coughing does not make any difference she does not sweat does not have shortness of breath with this its not provoked by exercises.  She also described to have some fatigue tiredness as well as shortness of breath with exertion.  She does not smoke, never did.  She does have dyslipidemia she is very successfully managed by her primary care physician with statin, she does have family history of premature coronary artery disease have, his father had myocardial infarction before age of 89 however he was smoking as well as drinker. She never completely passed out, she does not exercise on the regular basis, she is not on any special diet.    Past Medical History:  Diagnosis Date   Anxiety     Hyperlipidemia     Past Surgical History:  Procedure Laterality Date   ABDOMINAL HYSTERECTOMY     OOPHORECTOMY Right    TONSILLECTOMY     TUBAL LIGATION      Current Medications: Current Meds  Medication Sig   citalopram (CELEXA) 10 MG tablet Take 1 tablet (10 mg total) by mouth daily.   desonide (DESOWEN) 0.05 % cream Apply 1 application topically 2 (two) times daily.   estradiol (ESTRACE) 2 MG tablet Take 2 mg by mouth daily.   gabapentin (NEURONTIN) 100 MG capsule Take 1 capsule (100 mg total) by mouth 2 (two) times daily.   LORazepam (ATIVAN) 0.5 MG tablet Take 0.5 mg by mouth as needed for anxiety.   meloxicam (MOBIC) 7.5 MG tablet Take 7.5 mg by mouth daily.   rosuvastatin (CRESTOR) 10 MG tablet Take 10 mg by mouth daily.   [DISCONTINUED] desonide (DESOWEN) 0.05 % cream Apply topically 2 (two) times daily. (Patient taking differently: Apply 1 application topically 2 (two) times daily.)     Allergies:   Penicillins   Social History   Socioeconomic History   Marital status: Married    Spouse name: Not on file   Number of children: 2   Years of education: Not on file   Highest education level: Not on file  Occupational History   Occupation: [employer]  Tobacco Use   Smoking status: Never   Smokeless tobacco: Never  Vaping Use   Vaping Use: Never used  Substance and Sexual Activity   Alcohol use: Yes    Comment: social   Drug use: Never   Sexual activity: Not on file  Other Topics Concern   Not on file  Social History Narrative   Not on file   Social Determinants of Health   Financial Resource Strain: Not on file  Food Insecurity: Not on file  Transportation Needs: Not on file  Physical Activity: Not on file  Stress: Not on file  Social Connections: Not on file     Family History: The patient's family history includes Coronary artery disease in her father. ROS:   Please see the history of present illness.    All 14 point review of systems negative  except as described per history of present illness.  EKGs/Labs/Other Studies Reviewed:    The following studies were reviewed today:   EKG:  EKG is  ordered today.  The ekg ordered today demonstrates normal sinus rhythm normal P interval normal QS complex duration fulgent no ST segment changes  Recent Labs: 02/12/2021: ALT 11; BUN 16; Creatinine, Ser 0.81; Hemoglobin 12.2; Platelets 198; Potassium 4.5; Sodium 139; TSH 2.390  Recent Lipid Panel    Component Value Date/Time   CHOL 156 02/12/2021 0932   TRIG 137 02/12/2021 0932   HDL 67 02/12/2021 0932   CHOLHDL 2.3 02/12/2021 0932   LDLCALC 66 02/12/2021 0932    Physical Exam:    VS:  BP 124/82 (BP Location: Right Arm, Patient Position: Sitting)    Pulse 71    Ht 5\' 7"  (1.702 m)    Wt 165 lb 3.2 oz (74.9 kg)    SpO2 95%    BMI 25.87 kg/m     Wt Readings from Last 3 Encounters:  03/22/21 165 lb 3.2 oz (74.9 kg)  03/21/21 165 lb (74.8 kg)  02/15/21 163 lb 12.8 oz (74.3 kg)     GEN:  Well nourished, well developed in no acute distress HEENT: Normal NECK: No JVD; No carotid bruits LYMPHATICS: No lymphadenopathy CARDIAC: RRR, no murmurs, no rubs, no gallops RESPIRATORY:  Clear to auscultation without rales, wheezing or rhonchi  ABDOMEN: Soft, non-tender, non-distended MUSCULOSKELETAL:  No edema; No deformity  SKIN: Warm and dry NEUROLOGIC:  Alert and oriented x 3 PSYCHIATRIC:  Normal affect   ASSESSMENT:    1. Palpitations   2. Dyspnea on exertion   3. Atypical chest pain   4. Dyslipidemia    PLAN:    In order of problems listed above:  Palpitations.  I will ask her to wear Zio patch for 2 weeks to see if she got any significant arrhythmia.  As a part of evaluation echocardiogram will be done to check left ventricle ejection fraction. Dyspnea on exertion: Echocardiogram will be performed. Chest pain which is atypical with some risk factors however not tremendous.  I think she can benefit from calcium score which I  will order. Dyslipidemia she is on statin with excellent cholesterol L LDL is 66 HDL 67.  We will continue present management   Medication Adjustments/Labs and Tests Ordered: Current medicines are reviewed at length with the patient today.  Concerns regarding medicines are outlined above.  No orders of the defined types were placed in this encounter.  No orders of the defined types were placed in this encounter.   Signed, 14/08/22, MD, [facility]. 03/22/2021 4:07 PM    [facility]

## 2021-03-22 NOTE — Patient Instructions (Addendum)
Medication Instructions:  Your physician recommends that you continue on your current medications as directed. Please refer to the Current Medication list given to you today.  *If you need a refill on your cardiac medications before your next appointment, please call your pharmacy*   Lab Work: None If you have labs (blood work) drawn today and your tests are completely normal, you will receive your results only by: MyChart Message (if you have MyChart) OR A paper copy in the mail If you have any lab test that is abnormal or we need to change your treatment, we will call you to review the results.   Testing/Procedures: Your physician has requested that you have an echocardiogram. Echocardiography is a painless test that uses sound waves to create images of your heart. It provides your doctor with information about the size and shape of your heart and how well your hearts chambers and valves are working. This procedure takes approximately one hour. There are no restrictions for this procedure.   We will order CT coronary calcium score. It will cost $99.00 and is not covered by insurance.  Please call [phone] to schedule.   [facility]  [address]  [place], [place] [address]    Follow-Up: At [facility], you and your health needs are our priority.  As part of our continuing mission to provide you with exceptional heart care, we have created designated Provider Care Teams.  These Care Teams include your primary Cardiologist (physician) and Advanced Practice Providers (APPs -  Physician Assistants and Nurse Practitioners) who all work together to provide you with the care you need, when you need it.  We recommend signing up for the patient portal called "MyChart".  Sign up information is provided on this After Visit Summary.  MyChart is used to connect with patients for Virtual Visits (Telemedicine).  Patients are able to view lab/test results, encounter notes,  upcoming appointments, etc.  Non-urgent messages can be sent to your provider as well.   To learn more about what you can do with MyChart, go to [email or web address].    Your next appointment:   2 month(s)  The format for your next appointment:   In Person  Provider:   Gypsy Balsam, MD

## 2021-03-27 ENCOUNTER — Other Ambulatory Visit: Payer: Self-pay | Admitting: Cardiology

## 2021-03-27 DIAGNOSIS — R0789 Other chest pain: Secondary | ICD-10-CM

## 2021-03-27 DIAGNOSIS — Z8249 Family history of ischemic heart disease and other diseases of the circulatory system: Secondary | ICD-10-CM

## 2021-04-12 ENCOUNTER — Other Ambulatory Visit: Payer: Managed Care, Other (non HMO)

## 2021-04-18 ENCOUNTER — Ambulatory Visit (INDEPENDENT_AMBULATORY_CARE_PROVIDER_SITE_OTHER): Payer: Managed Care, Other (non HMO)

## 2021-04-18 ENCOUNTER — Other Ambulatory Visit: Payer: Self-pay

## 2021-04-18 DIAGNOSIS — R002 Palpitations: Secondary | ICD-10-CM

## 2021-04-18 DIAGNOSIS — R0789 Other chest pain: Secondary | ICD-10-CM

## 2021-04-18 DIAGNOSIS — E785 Hyperlipidemia, unspecified: Secondary | ICD-10-CM | POA: Diagnosis not present

## 2021-04-18 DIAGNOSIS — R0609 Other forms of dyspnea: Secondary | ICD-10-CM

## 2021-04-18 DIAGNOSIS — I081 Rheumatic disorders of both mitral and tricuspid valves: Secondary | ICD-10-CM | POA: Diagnosis not present

## 2021-04-18 LAB — ECHOCARDIOGRAM COMPLETE
Area-P 1/2: 3.39 cm2
MV M vel: 5.26 m/s
MV Peak grad: 110.5 mmHg
Radius: 0.5 cm
S' Lateral: 3.4 cm

## 2021-04-20 ENCOUNTER — Telehealth: Payer: Self-pay

## 2021-04-20 MED ORDER — METOPROLOL TARTRATE 25 MG PO TABS: 25.0000 mg | ORAL_TABLET | Freq: Two times a day (BID) | ORAL | 1 refills | Status: DC

## 2021-04-20 NOTE — Telephone Encounter (Signed)
-----   Message from Georgeanna Lea, MD sent at 04/20/2021 10:32 AM EST ----- Echocardiogram showed preserved left ventricle ejection fraction, mild to moderate mitral valve regurgitation overall looks good.  Mild prolapse of the posterior leaflet of the mitral valve noted.  Medical therapy

## 2021-04-20 NOTE — Telephone Encounter (Signed)
-----   Message from Park Liter, MD sent at 04/20/2021 10:40 AM EST ----- Multiple episode of supraventricular tachycardia, I recommend to start metoprolol 25 mg twice daily metoprolol titrate there is

## 2021-04-20 NOTE — Telephone Encounter (Signed)
Patent notified of results

## 2021-04-20 NOTE — Telephone Encounter (Signed)
Patient notified of results and recommendations and agree to plan. Rx sent to confirmed pharmacy.

## 2021-04-23 ENCOUNTER — Other Ambulatory Visit: Payer: Self-pay | Admitting: Physician Assistant

## 2021-04-23 DIAGNOSIS — F419 Anxiety disorder, unspecified: Secondary | ICD-10-CM

## 2021-04-23 MED ORDER — CITALOPRAM HYDROBROMIDE 10 MG PO TABS: 10.0000 mg | ORAL_TABLET | Freq: Every day | ORAL | 1 refills | Status: DC

## 2021-05-03 ENCOUNTER — Inpatient Hospital Stay: Admission: RE | Admit: 2021-05-03 | Payer: Self-pay | Source: Ambulatory Visit

## 2021-05-07 ENCOUNTER — Other Ambulatory Visit: Payer: Self-pay | Admitting: Physician Assistant

## 2021-05-11 ENCOUNTER — Other Ambulatory Visit: Payer: Self-pay | Admitting: Physician Assistant

## 2021-05-14 ENCOUNTER — Telehealth: Payer: Self-pay | Admitting: Cardiology

## 2021-05-14 NOTE — Telephone Encounter (Signed)
Patient c/o Palpitations:  High priority if patient c/o lightheadedness, shortness of breath, or chest pain ? ?How long have you had palpitations/irregular HR/ Afib? Are you having the symptoms now?  ? ?Patient following up. States since going on Metoprolol on 2/10 she has still been experiencing palpitations, but they haven't been as frequent. No symptoms currently, but patient would like to know what Dr. Agustin Cree recommends. Does she need to continue with this regimen? Metoprolol 25 MG 2x daily or try an alternative. Please advise. ? ?Are you currently experiencing lightheadedness, SOB or CP?  ?No  ? ?Do you have a history of afib (atrial fibrillation) or irregular heart rhythm?  ?Yes  ? ?Have you checked your BP or HR? (document readings if available):  ?No  ? ?Are you experiencing any other symptoms?  ?No  ? ?

## 2021-05-15 ENCOUNTER — Other Ambulatory Visit: Payer: Self-pay

## 2021-05-15 MED ORDER — METOPROLOL TARTRATE 50 MG PO TABS: 50.0000 mg | ORAL_TABLET | Freq: Two times a day (BID) | ORAL | 3 refills | Status: DC

## 2021-05-15 NOTE — Telephone Encounter (Signed)
Called patient and informed her of Dr. Vanetta Shawl recommendation. Patient was agreeable to the medication dose change. Orders were placed for the new dose of the medication. ?

## 2021-05-16 ENCOUNTER — Ambulatory Visit (INDEPENDENT_AMBULATORY_CARE_PROVIDER_SITE_OTHER)
Admission: RE | Admit: 2021-05-16 | Discharge: 2021-05-16 | Disposition: A | Discharge status: Home / Self Care | Payer: Self-pay | Source: Ambulatory Visit | Attending: Cardiology | Admitting: Cardiology

## 2021-05-16 ENCOUNTER — Other Ambulatory Visit: Payer: Self-pay

## 2021-05-16 DIAGNOSIS — Z8249 Family history of ischemic heart disease and other diseases of the circulatory system: Secondary | ICD-10-CM

## 2021-05-16 DIAGNOSIS — R0789 Other chest pain: Secondary | ICD-10-CM

## 2021-05-17 ENCOUNTER — Inpatient Hospital Stay: Admission: RE | Admit: 2021-05-17 | Payer: Self-pay | Source: Ambulatory Visit

## 2021-05-23 ENCOUNTER — Ambulatory Visit: Payer: Managed Care, Other (non HMO) | Admitting: Cardiology

## 2021-05-23 ENCOUNTER — Encounter: Payer: Self-pay | Admitting: Cardiology

## 2021-05-23 ENCOUNTER — Other Ambulatory Visit: Payer: Self-pay

## 2021-05-23 VITALS — BP 132/84 | HR 111 | Ht 67.0 in | Wt 167.0 lb

## 2021-05-23 DIAGNOSIS — I2510 Atherosclerotic heart disease of native coronary artery without angina pectoris: Secondary | ICD-10-CM | POA: Diagnosis not present

## 2021-05-23 DIAGNOSIS — I2584 Coronary atherosclerosis due to calcified coronary lesion: Secondary | ICD-10-CM | POA: Diagnosis not present

## 2021-05-23 DIAGNOSIS — E782 Mixed hyperlipidemia: Secondary | ICD-10-CM

## 2021-05-23 DIAGNOSIS — I471 Supraventricular tachycardia: Secondary | ICD-10-CM | POA: Diagnosis not present

## 2021-05-23 MED ORDER — METOPROLOL TARTRATE 50 MG PO TABS: 50.0000 mg | ORAL_TABLET | Freq: Three times a day (TID) | ORAL | 3 refills | Status: DC

## 2021-05-23 NOTE — Patient Instructions (Addendum)
Medication Instructions:  ?Your physician has recommended you make the following change in your medication:  ? ?START: Metoprolol tartrate 50 mg three times a day ? ?*If you need a refill on your cardiac medications before your next appointment, please call your pharmacy* ? ? ?Lab Work: ?Labs today: Lpa ?If you have labs (blood work) drawn today and your tests are completely normal, you will receive your results only by: ?MyChart Message (if you have MyChart) OR ?A paper copy in the mail ?If you have any lab test that is abnormal or we need to change your treatment, we will call you to review the results. ? ? ?Testing/Procedures: ?None ? ? ?Follow-Up: ?At [facility], you and your health needs are our priority.  As part of our continuing mission to provide you with exceptional heart care, we have created designated Provider Care Teams.  These Care Teams include your primary Cardiologist (physician) and Advanced Practice Providers (APPs -  Physician Assistants and Nurse Practitioners) who all work together to provide you with the care you need, when you need it. ? ?We recommend signing up for the patient portal called "MyChart".  Sign up information is provided on this After Visit Summary.  MyChart is used to connect with patients for Virtual Visits (Telemedicine).  Patients are able to view lab/test results, encounter notes, upcoming appointments, etc.  Non-urgent messages can be sent to your provider as well.   ?To learn more about what you can do with MyChart, go to [email or web address].   ? ?Your next appointment:   ?4 month(s) ? ?The format for your next appointment:   ?In Person ? ?Provider:   ?Gypsy Balsam, MD  ? ? ?Other Instructions ?None ? ?

## 2021-05-23 NOTE — Progress Notes (Signed)
?Cardiology Office Note:   ? ?Date:  05/23/2021  ? ?ID:  Jeanne Jacobson, DOB Feb 14, 1971, MRN [id number] ? ?PCP:  Marianne Sofia, PA-C  ?Cardiologist:  Gypsy Balsam, MD   ? ?Referring MD: Marianne Sofia, PA-C  ? ?Chief Complaint  ?Patient presents with  ? Results  ?  Palpitation continues   ? ? ?History of Present Illness:   ? ?Jeanne Jacobson is a 51 y.o. female with past medical history significant for hyperlipidemia, anxiety, she was referred to [place] because of palpitations.  She did wear a monitor which shows some episode of supraventricular tachycardia.  I put her on a small dose of beta-blocker she is taking 25 mg of metoprolol tartrate twice daily with some improvement but still palpitations at present.  She also had echocardiogram performed which showed preserved left ventricle ejection fraction.  She did have a calcium score done which surprisingly showed some very small deposits of calcium in the LAD calcium score is barely above 20. ?She said that overall she is doing better but still palpitation at present.  She described to have some atypical chest pain which she mean by that she would feel sharp stabbing-like sensation happening at rest not related to exercise. ? ?Past Medical History:  ?Diagnosis Date  ? Anxiety   ? Hyperlipidemia   ? ? ?Past Surgical History:  ?Procedure Laterality Date  ? ABDOMINAL HYSTERECTOMY    ? OOPHORECTOMY Right   ? TONSILLECTOMY    ? TUBAL LIGATION    ? ? ?Current Medications: ?Current Meds  ?Medication Sig  ? citalopram (CELEXA) 10 MG tablet Take 1 tablet (10 mg total) by mouth daily.  ? estradiol (ESTRACE) 2 MG tablet TAKE 1 TABLET DAILY (Patient taking differently: Take 2 mg by mouth daily.)  ? gabapentin (NEURONTIN) 100 MG capsule Take 1 capsule (100 mg total) by mouth 2 (two) times daily.  ? meloxicam (MOBIC) 7.5 MG tablet Take 7.5 mg by mouth daily.  ? metoprolol tartrate (LOPRESSOR) 50 MG tablet Take 1 tablet (50 mg total) by mouth in the morning, at noon, and at bedtime.   ? rosuvastatin (CRESTOR) 10 MG tablet TAKE 1 TABLET DAILY (Patient taking differently: Take 10 mg by mouth daily.)  ? [DISCONTINUED] metoprolol tartrate (LOPRESSOR) 50 MG tablet Take 1 tablet (50 mg total) by mouth 2 (two) times daily.  ?  ? ?Allergies:   Penicillins  ? ?Social History  ? ?Socioeconomic History  ? Marital status: Married  ?  Spouse name: Not on file  ? Number of children: 2  ? Years of education: Not on file  ? Highest education level: Not on file  ?Occupational History  ? Occupation: [employer]  ?Tobacco Use  ? Smoking status: Never  ? Smokeless tobacco: Never  ?Vaping Use  ? Vaping Use: Never used  ?Substance and Sexual Activity  ? Alcohol use: Yes  ?  Comment: social  ? Drug use: Never  ? Sexual activity: Not on file  ?Other Topics Concern  ? Not on file  ?Social History Narrative  ? Not on file  ? ?Social Determinants of Health  ? ?Financial Resource Strain: Not on file  ?Food Insecurity: Not on file  ?Transportation Needs: Not on file  ?Physical Activity: Not on file  ?Stress: Not on file  ?Social Connections: Not on file  ?  ? ?Family History: ?The patient's family history includes Coronary artery disease in her father. ?ROS:   ?Please see the history of present illness.    ?All 14  point review of systems negative except as described per history of present illness ? ?EKGs/Labs/Other Studies Reviewed:   ? ? ? ?Recent Labs: ?02/12/2021: ALT 11; BUN 16; Creatinine, Ser 0.81; Hemoglobin 12.2; Platelets 198; Potassium 4.5; Sodium 139; TSH 2.390  ?Recent Lipid Panel ?   ?Component Value Date/Time  ? CHOL 156 02/12/2021 0932  ? TRIG 137 02/12/2021 0932  ? HDL 67 02/12/2021 0932  ? CHOLHDL 2.3 02/12/2021 0932  ? LDLCALC 66 02/12/2021 0932  ? ? ?Physical Exam:   ? ?VS:  BP 132/84 (BP Location: Right Arm, Patient Position: Sitting)   Pulse (!) 111   Ht 5\' 7"  (1.702 m)   Wt 167 lb (75.8 kg)   SpO2 97%   BMI 26.16 kg/m?    ? ?Wt Readings from Last 3 Encounters:  ?05/23/21 167 lb (75.8 kg)  ?03/22/21  165 lb 3.2 oz (74.9 kg)  ?03/21/21 165 lb (74.8 kg)  ?  ? ?GEN:  Well nourished, well developed in no acute distress ?HEENT: Normal ?NECK: No JVD; No carotid bruits ?LYMPHATICS: No lymphadenopathy ?CARDIAC: RRR, no murmurs, no rubs, no gallops ?RESPIRATORY:  Clear to auscultation without rales, wheezing or rhonchi  ?ABDOMEN: Soft, non-tender, non-distended ?MUSCULOSKELETAL:  No edema; No deformity  ?SKIN: Warm and dry ?LOWER EXTREMITIES: no swelling ?NEUROLOGIC:  Alert and oriented x 3 ?PSYCHIATRIC:  Normal affect  ? ?ASSESSMENT:   ? ?1. Mixed hyperlipidemia   ?2. Supraventricular tachycardia (HCC)   ?3. Coronary artery calcification calcium score 20 in 2023   ? ?PLAN:   ? ?In order of problems listed above: ? ?Mixed dyslipidemia I did review her K PN which show me her LDL of 66 HDL 67.  I have to admit I am surprised that she does have some calcium deposit in the LAD.  I will ask him to have LP(a) checked to make sure we are not dealing with some abnormalities there. ?Supraventricular tachycardia still present but improved is a new diagnosis.  I asked her to increase dose of metoprolol titrate to 3 times daily.  If that helps and if will be sufficient I will switch her to long-acting form of the medication ?Coronary calcium score being 20.  We did talk about healthy diet need to exercise on the regular basis.  Which I strongly encouraged to do ? ? ?Medication Adjustments/Labs and Tests Ordered: ?Current medicines are reviewed at length with the patient today.  Concerns regarding medicines are outlined above.  ?No orders of the defined types were placed in this encounter. ? ?Medication changes:  ?Meds ordered this encounter  ?Medications  ? metoprolol tartrate (LOPRESSOR) 50 MG tablet  ?  Sig: Take 1 tablet (50 mg total) by mouth in the morning, at noon, and at bedtime.  ?  Dispense:  180 tablet  ?  Refill:  3  ? ? ?Signed, ?2024, MD, [facility] ?05/23/2021 5:03 PM    ?[facility] ?

## 2021-05-25 LAB — LIPOPROTEIN A (LPA): Lipoprotein (a): <8.4 nmol/L (ref <75.0)

## 2021-05-31 ENCOUNTER — Telehealth: Payer: Self-pay

## 2021-05-31 NOTE — Telephone Encounter (Signed)
-----   Message from Park Liter, MD sent at 05/28/2021 11:20 AM EDT ----- ?LP(a) very low it is good news. ?

## 2021-05-31 NOTE — Telephone Encounter (Signed)
LM to return my call and as a last attempt I mailed a letter requesting a call back. ?

## 2021-06-01 NOTE — Telephone Encounter (Signed)
Patient was returning call for results. Please advise °

## 2021-06-01 NOTE — Telephone Encounter (Signed)
Patient informed of results.  

## 2021-06-07 ENCOUNTER — Telehealth: Payer: Self-pay | Admitting: Physician Assistant

## 2021-06-07 NOTE — Telephone Encounter (Signed)
PATIENT SCHEDULED MAMMOGRAM FOR 07/10/21 @ [facility] ?

## 2021-06-28 ENCOUNTER — Ambulatory Visit: Payer: Managed Care, Other (non HMO) | Admitting: Physician Assistant

## 2021-06-28 ENCOUNTER — Encounter: Payer: Self-pay | Admitting: Physician Assistant

## 2021-06-28 VITALS — BP 114/74 | HR 60 | Temp 98.3°F | Ht 67.0 in | Wt 168.8 lb

## 2021-06-28 DIAGNOSIS — R002 Palpitations: Secondary | ICD-10-CM | POA: Diagnosis not present

## 2021-06-28 DIAGNOSIS — F419 Anxiety disorder, unspecified: Secondary | ICD-10-CM

## 2021-06-28 DIAGNOSIS — R5383 Other fatigue: Secondary | ICD-10-CM

## 2021-06-28 NOTE — Progress Notes (Signed)
? ?Acute Office Visit ? ?Subjective:  ? ? Patient ID: Jeanne Jacobson, female    DOB: Jun 16, 1970, 51 y.o.   MRN: [id number] ? ?Chief Complaint  ?Patient presents with  ? Medication Refill  ? ? ?HPI: ?Patient is in today for complaints of malaise and fatigue - she is concerned that her celexa that she has been taking from November may have been causing that along with the palpitations that she has been having and seeing cardiology about ?She actually states that the celexa had been working well ?She noted the fatigue worsening after being increased on metoprolol 14m up to tid by cardiology ?As far as her anxiety she feels the citalopram working well for her ? ?Past Medical History:  ?Diagnosis Date  ? Anxiety   ? Hyperlipidemia   ? ? ?Past Surgical History:  ?Procedure Laterality Date  ? ABDOMINAL HYSTERECTOMY    ? OOPHORECTOMY Right   ? TONSILLECTOMY    ? TUBAL LIGATION    ? ? ?Family History  ?Problem Relation Age of Onset  ? Coronary artery disease Father   ? ? ?Social History  ? ?Socioeconomic History  ? Marital status: Married  ?  Spouse name: Not on file  ? Number of children: 2  ? Years of education: Not on file  ? Highest education level: Not on file  ?Occupational History  ? Occupation: [employer]  ?Tobacco Use  ? Smoking status: Never  ? Smokeless tobacco: Never  ?Vaping Use  ? Vaping Use: Never used  ?Substance and Sexual Activity  ? Alcohol use: Yes  ?  Comment: social  ? Drug use: Never  ? Sexual activity: Not on file  ?Other Topics Concern  ? Not on file  ?Social History Narrative  ? Not on file  ? ?Social Determinants of Health  ? ?Financial Resource Strain: Not on file  ?Food Insecurity: Not on file  ?Transportation Needs: Not on file  ?Physical Activity: Not on file  ?Stress: Not on file  ?Social Connections: Not on file  ?Intimate Partner Violence: Not on file  ? ? ?Outpatient Medications Prior to Visit  ?Medication Sig Dispense Refill  ? citalopram (CELEXA) 10 MG tablet Take 1 tablet (10 mg total)  by mouth daily. 90 tablet 1  ? estradiol (ESTRACE) 2 MG tablet TAKE 1 TABLET DAILY (Patient taking differently: Take 2 mg by mouth daily.) 90 tablet 1  ? gabapentin (NEURONTIN) 100 MG capsule Take 1 capsule (100 mg total) by mouth 2 (two) times daily. 180 capsule 1  ? meloxicam (MOBIC) 7.5 MG tablet Take 7.5 mg by mouth daily.    ? metoprolol tartrate (LOPRESSOR) 50 MG tablet Take 1 tablet (50 mg total) by mouth in the morning, at noon, and at bedtime. 180 tablet 3  ? rosuvastatin (CRESTOR) 10 MG tablet TAKE 1 TABLET DAILY (Patient taking differently: Take 10 mg by mouth daily.) 90 tablet 1  ? ?No facility-administered medications prior to visit.  ? ? ?Allergies  ?Allergen Reactions  ? Penicillins   ? ? ?Review of Systems ?CONSTITUTIONAL: see HPI ?CARDIOVASCULAR:see HPI ?RESPIRATORY: Negative for recent cough and dyspnea.  ?GASTROINTESTINAL: Negative for abdominal pain, acid reflux symptoms, constipation, diarrhea, nausea and vomiting.  ?MSK: Negative for arthralgias and myalgias.  ?INTEGUMENTARY: Negative for rash.  ? ?PSYCHIATRIC:see HPI ?   ? ?   ?Objective:  ?  ?Physical Exam ?PHYSICAL EXAM:  ? ?VS: BP 114/74   Pulse 60   Temp 98.3 ?F (36.8 ?C)   Ht 5' 7"  (  1.702 m)   Wt 168 lb 12.8 oz (76.6 kg)   SpO2 98%   BMI 26.44 kg/m?  ? ?GEN: Well nourished, well developed, in no acute distress  ?Cardiac: RRR; no murmurs, ?Respiratory:  normal respiratory rate and pattern with no distress - normal breath sounds with no rales, rhonchi, wheezes or rubs ? ?Psych: euthymic mood, appropriate affect and demeanor ? ?BP 114/74   Pulse 60   Temp 98.3 ?F (36.8 ?C)   Ht 5' 7"  (1.702 m)   Wt 168 lb 12.8 oz (76.6 kg)   SpO2 98%   BMI 26.44 kg/m?  ?Wt Readings from Last 3 Encounters:  ?06/28/21 168 lb 12.8 oz (76.6 kg)  ?05/23/21 167 lb (75.8 kg)  ?03/22/21 165 lb 3.2 oz (74.9 kg)  ? ? ?Health Maintenance Due  ?Topic Date Due  ? COLONOSCOPY (Pts 45-63yr Insurance coverage will need to be confirmed)  Never done  ? Zoster  Vaccines- Shingrix (1 of 2) Never done  ? MAMMOGRAM  05/31/2021  ? ? ?There are no preventive care reminders to display for this patient. ? ? ?Lab Results  ?Component Value Date  ? TSH 2.390 02/12/2021  ? ?Lab Results  ?Component Value Date  ? WBC 4.8 02/12/2021  ? HGB 12.2 02/12/2021  ? HCT 36.4 02/12/2021  ? MCV 91 02/12/2021  ? PLT 198 02/12/2021  ? ?Lab Results  ?Component Value Date  ? NA 139 02/12/2021  ? K 4.5 02/12/2021  ? CO2 24 02/12/2021  ? GLUCOSE 91 02/12/2021  ? BUN 16 02/12/2021  ? CREATININE 0.81 02/12/2021  ? BILITOT 0.3 02/12/2021  ? ALKPHOS 53 02/12/2021  ? AST 10 02/12/2021  ? ALT 11 02/12/2021  ? PROT 6.6 02/12/2021  ? ALBUMIN 4.3 02/12/2021  ? CALCIUM 9.2 02/12/2021  ? EGFR 88 02/12/2021  ? ?Lab Results  ?Component Value Date  ? CHOL 156 02/12/2021  ? ?Lab Results  ?Component Value Date  ? HDL 67 02/12/2021  ? ?Lab Results  ?Component Value Date  ? [place] 02/12/2021  ? ?Lab Results  ?Component Value Date  ? TRIG 137 02/12/2021  ? ?Lab Results  ?Component Value Date  ? CHOLHDL 2.3 02/12/2021  ? ?No results found for: HGBA1C ? ?   ?Assessment & Plan:  ? ?Problem List Items Addressed This Visit   ? ?  ? Other  ? Continue current meds  ? Palpitations ?If labwork normal recommend to speak with cardiology about trying calcium channel blocker for palpitations in place of beta blocker which is possibly causing fatigue  ? ?Other Visit Diagnoses   ? ? Other fatigue    -  Primary  ? Relevant Orders  ? CBC with Differential/Platelet  ? Comprehensive metabolic panel  ? TSH  ? ?  ? ?No orders of the defined types were placed in this encounter. ? ? ?Orders Placed This Encounter  ?Procedures  ? CBC with Differential/Platelet  ? Comprehensive metabolic panel  ? TSH  ?  ? ?Follow-up: Return if symptoms worsen or fail to improve. ? ?An After Visit Summary was printed and given to the patient. ? ?SARA R Madyson Lukach, PA-C ?[facility] [place]?([phone]?

## 2021-06-29 LAB — CBC WITH DIFFERENTIAL/PLATELET
Basophils Absolute: 0 10*3/uL (ref 0.0–0.2)
Basos: 1 %
EOS (ABSOLUTE): 0.1 10*3/uL (ref 0.0–0.4)
Eos: 2 %
Hematocrit: 35.9 % (ref 34.0–46.6)
Hemoglobin: 12.2 g/dL (ref 11.1–15.9)
Immature Grans (Abs): 0 10*3/uL (ref 0.0–0.1)
Immature Granulocytes: 0 %
Lymphocytes Absolute: 2.8 10*3/uL (ref 0.7–3.1)
Lymphs: 47 %
MCH: 31.4 pg (ref 26.6–33.0)
MCHC: 34 g/dL (ref 31.5–35.7)
MCV: 92 fL (ref 79–97)
Monocytes Absolute: 0.5 10*3/uL (ref 0.1–0.9)
Monocytes: 8 %
Neutrophils Absolute: 2.5 10*3/uL (ref 1.4–7.0)
Neutrophils: 42 %
Platelets: 197 10*3/uL (ref 150–450)
RBC: 3.89 x10E6/uL (ref 3.77–5.28)
RDW: 12.9 % (ref 11.7–15.4)
WBC: 6 10*3/uL (ref 3.4–10.8)

## 2021-06-29 LAB — COMPREHENSIVE METABOLIC PANEL
ALT: 11 IU/L (ref 0–32)
AST: 14 IU/L (ref 0–40)
Albumin/Globulin Ratio: 1.9 (ref 1.2–2.2)
Albumin: 4.5 g/dL (ref 3.8–4.9)
Alkaline Phosphatase: 59 IU/L (ref 44–121)
BUN/Creatinine Ratio: 19 (ref 9–23)
BUN: 16 mg/dL (ref 6–24)
Bilirubin Total: <0.2 mg/dL (ref 0.0–1.2)
CO2: 24 mmol/L (ref 20–29)
Calcium: 9.6 mg/dL (ref 8.7–10.2)
Chloride: 102 mmol/L (ref 96–106)
Creatinine, Ser: 0.85 mg/dL (ref 0.57–1.00)
Globulin, Total: 2.4 g/dL (ref 1.5–4.5)
Glucose: 104 mg/dL — ABNORMAL HIGH (ref 70–99)
Potassium: 4.7 mmol/L (ref 3.5–5.2)
Sodium: 139 mmol/L (ref 134–144)
Total Protein: 6.9 g/dL (ref 6.0–8.5)
eGFR: 83 mL/min/{1.73_m2} (ref >59)

## 2021-06-29 LAB — TSH: TSH: 2.59 u[IU]/mL (ref 0.450–4.500)

## 2021-07-03 ENCOUNTER — Telehealth: Payer: Self-pay | Admitting: Cardiology

## 2021-07-03 NOTE — Telephone Encounter (Signed)
Pt c/o medication issue: ? ?1. Name of Medication:  ? metoprolol tartrate (LOPRESSOR) 50 MG tablet  ? ? ?2. How are you currently taking this medication (dosage and times per day)? Take 1 tablet (50 mg total) by mouth  ? ?3. Are you having a reaction (difficulty breathing--STAT)? No ? ?4. What is your medication issue? Pt states that medication is making her tired and sleepy. Please advise ? ?

## 2021-07-03 NOTE — Telephone Encounter (Signed)
Pt notified to decrease Metoprolol to 50mg  twice daily per Dr. Wendy Poet note. Pt agreed and will call back for any questions or concerns.  ?

## 2021-07-12 ENCOUNTER — Other Ambulatory Visit: Payer: Self-pay

## 2021-07-12 DIAGNOSIS — Z1231 Encounter for screening mammogram for malignant neoplasm of breast: Secondary | ICD-10-CM

## 2021-09-17 ENCOUNTER — Other Ambulatory Visit: Payer: Managed Care, Other (non HMO)

## 2021-09-17 ENCOUNTER — Other Ambulatory Visit: Payer: Self-pay | Admitting: Physician Assistant

## 2021-09-17 DIAGNOSIS — I471 Supraventricular tachycardia, unspecified: Secondary | ICD-10-CM

## 2021-09-17 DIAGNOSIS — R002 Palpitations: Secondary | ICD-10-CM

## 2021-09-17 DIAGNOSIS — F419 Anxiety disorder, unspecified: Secondary | ICD-10-CM

## 2021-09-17 DIAGNOSIS — R5383 Other fatigue: Secondary | ICD-10-CM

## 2021-09-17 DIAGNOSIS — E782 Mixed hyperlipidemia: Secondary | ICD-10-CM

## 2021-09-18 LAB — LIPID PANEL
Chol/HDL Ratio: 2.6 ratio (ref 0.0–4.4)
Cholesterol, Total: 163 mg/dL (ref 100–199)
HDL: 62 mg/dL (ref >39)
LDL Chol Calc (NIH): 69 mg/dL (ref 0–99)
Triglycerides: 197 mg/dL — ABNORMAL HIGH (ref 0–149)
VLDL Cholesterol Cal: 32 mg/dL (ref 5–40)

## 2021-09-18 LAB — COMPREHENSIVE METABOLIC PANEL
ALT: 11 IU/L (ref 0–32)
AST: 7 IU/L (ref 0–40)
Albumin/Globulin Ratio: 1.8 (ref 1.2–2.2)
Albumin: 4.2 g/dL (ref 3.8–4.9)
Alkaline Phosphatase: 53 IU/L (ref 44–121)
BUN/Creatinine Ratio: 18 (ref 9–23)
BUN: 16 mg/dL (ref 6–24)
Bilirubin Total: 0.3 mg/dL (ref 0.0–1.2)
CO2: 23 mmol/L (ref 20–29)
Calcium: 9.2 mg/dL (ref 8.7–10.2)
Chloride: 101 mmol/L (ref 96–106)
Creatinine, Ser: 0.91 mg/dL (ref 0.57–1.00)
Globulin, Total: 2.3 g/dL (ref 1.5–4.5)
Glucose: 89 mg/dL (ref 70–99)
Potassium: 4.6 mmol/L (ref 3.5–5.2)
Sodium: 137 mmol/L (ref 134–144)
Total Protein: 6.5 g/dL (ref 6.0–8.5)
eGFR: 76 mL/min/{1.73_m2} (ref >59)

## 2021-09-18 LAB — CBC WITH DIFFERENTIAL/PLATELET
Basophils Absolute: 0 10*3/uL (ref 0.0–0.2)
Basos: 1 %
EOS (ABSOLUTE): 0.1 10*3/uL (ref 0.0–0.4)
Eos: 2 %
Hematocrit: 35.9 % (ref 34.0–46.6)
Hemoglobin: 11.7 g/dL (ref 11.1–15.9)
Immature Grans (Abs): 0 10*3/uL (ref 0.0–0.1)
Immature Granulocytes: 0 %
Lymphocytes Absolute: 2.4 10*3/uL (ref 0.7–3.1)
Lymphs: 45 %
MCH: 29.9 pg (ref 26.6–33.0)
MCHC: 32.6 g/dL (ref 31.5–35.7)
MCV: 92 fL (ref 79–97)
Monocytes Absolute: 0.5 10*3/uL (ref 0.1–0.9)
Monocytes: 9 %
Neutrophils Absolute: 2.3 10*3/uL (ref 1.4–7.0)
Neutrophils: 43 %
Platelets: 200 10*3/uL (ref 150–450)
RBC: 3.91 x10E6/uL (ref 3.77–5.28)
RDW: 12.6 % (ref 11.7–15.4)
WBC: 5.3 10*3/uL (ref 3.4–10.8)

## 2021-09-18 LAB — VITAMIN D 25 HYDROXY (VIT D DEFICIENCY, FRACTURES): Vit D, 25-Hydroxy: 35.6 ng/mL (ref 30.0–100.0)

## 2021-09-18 LAB — TSH: TSH: 3.93 u[IU]/mL (ref 0.450–4.500)

## 2021-09-18 LAB — CARDIOVASCULAR RISK ASSESSMENT

## 2021-09-20 ENCOUNTER — Ambulatory Visit: Payer: Managed Care, Other (non HMO) | Admitting: Physician Assistant

## 2021-09-20 ENCOUNTER — Encounter: Payer: Self-pay | Admitting: Physician Assistant

## 2021-09-20 VITALS — BP 110/68 | HR 61 | Temp 97.1°F | Ht 67.0 in | Wt 174.6 lb

## 2021-09-20 DIAGNOSIS — E782 Mixed hyperlipidemia: Secondary | ICD-10-CM | POA: Diagnosis not present

## 2021-09-20 DIAGNOSIS — I471 Supraventricular tachycardia: Secondary | ICD-10-CM | POA: Diagnosis not present

## 2021-09-20 DIAGNOSIS — F419 Anxiety disorder, unspecified: Secondary | ICD-10-CM

## 2021-09-20 NOTE — Progress Notes (Signed)
Subjective:  Patient ID: Jeanne Jacobson, female    DOB: July 08, 1970  Age: 51 y.o. MRN: [id number]  Chief Complaint  Patient presents with   Hyperlipidemia    HPI  Pt presents for follow up of SVT  The patient is tolerating the medication well without side effects. Compliance with treatment has been good; including taking medication as directed , maintains a healthy diet and regular exercise regimen , and following up as directed. Currently on lopressor 50mg  qd - -recent cmp , tsh normal  Pt presents for follow up of hypertension. The patient is tolerating the medication well without side effects. Compliance with treatment has been good; including taking medication as directed , maintains a healthy diet and regular exercise regimen , and following up as directed. Is taking crestor 10mg  qd - recent labwork good except slightly increased triglycerides - told to watch diet  Pt with history of anxiety - says she had been doing well on citalopram 10mg  qd but thinks she needs slight increase in dose - overall had been tolerating well   Current Outpatient Medications on File Prior to Visit  Medication Sig Dispense Refill   citalopram (CELEXA) 10 MG tablet Take 1 tablet (10 mg total) by mouth daily. 90 tablet 1   estradiol (ESTRACE) 2 MG tablet TAKE 1 TABLET DAILY (Patient taking differently: Take 2 mg by mouth daily.) 90 tablet 1   gabapentin (NEURONTIN) 100 MG capsule Take 1 capsule (100 mg total) by mouth 2 (two) times daily. 180 capsule 1   meloxicam (MOBIC) 7.5 MG tablet Take 7.5 mg by mouth daily.     metoprolol tartrate (LOPRESSOR) 50 MG tablet Take 1 tablet (50 mg total) by mouth in the morning, at noon, and at bedtime. 180 tablet 3   rosuvastatin (CRESTOR) 10 MG tablet TAKE 1 TABLET DAILY (Patient taking differently: Take 10 mg by mouth daily.) 90 tablet 1   No current facility-administered medications on file prior to visit.   Past Medical History:  Diagnosis Date   Anxiety     Hyperlipidemia    Past Surgical History:  Procedure Laterality Date   ABDOMINAL HYSTERECTOMY     OOPHORECTOMY Right    TONSILLECTOMY     TUBAL LIGATION      Family History  Problem Relation Age of Onset   Coronary artery disease Father    Social History   Socioeconomic History   Marital status: Married    Spouse name: Not on file   Number of children: 2   Years of education: Not on file   Highest education level: Not on file  Occupational History   Occupation:  Tobacco Use   Smoking status: Never   Smokeless tobacco: Never  Vaping Use   Vaping Use: Never used  Substance and Sexual Activity   Alcohol use: Yes    Comment: social   Drug use: Never   Sexual activity: Not on file  Other Topics Concern   Not on file  Social History Narrative   Not on file   Social Determinants of Health   Financial Resource Strain: Not on file  Food Insecurity: Not on file  Transportation Needs: Not on file  Physical Activity: Not on file  Stress: Not on file  Social Connections: Not on file    Review of Systems  CONSTITUTIONAL: Negative for chills, fatigue, fever, unintentional weight gain and unintentional weight loss.   CARDIOVASCULAR: Negative for chest pain, dizziness, palpitations and pedal edema.  RESPIRATORY: Negative for recent  cough and dyspnea.  GASTROINTESTINAL: Negative for abdominal pain, acid reflux symptoms, constipation, diarrhea, nausea and vomiting.  MSK: Negative for arthralgias and myalgias.   NEUROLOGICAL: Negative for dizziness and headaches.  PSYCHIATRIC: Negative for sleep disturbance and to question depression screen.  Negative for depression, negative for anhedonia.      Objective:  PHYSICAL EXAM:   VS: BP 110/68 (BP Location: Left Arm, Patient Position: Sitting, Cuff Size: Normal)   Pulse 61   Temp (!) 97.1 F (36.2 C) (Temporal)   Ht 5\' 7"  (1.702 m)   Wt 174 lb 9.6 oz (79.2 kg)   SpO2 96%   BMI 27.35 kg/m   GEN: Well nourished,  well developed, in no acute distress   Cardiac: RRR; no murmurs, rubs, or gallops,no edema -  Respiratory:  normal respiratory rate and pattern with no distress - normal breath sounds with no rales, rhonchi, wheezes or rubs GI: normal bowel sounds, no masses or tenderness MS: no deformity or atrophy  Skin: warm and dry, no rash   Psych: euthymic mood, appropriate affect and demeanor   Lab Results  Component Value Date   WBC 5.3 09/17/2021   HGB 11.7 09/17/2021   HCT 35.9 09/17/2021   PLT 200 09/17/2021   GLUCOSE 89 09/17/2021   CHOL 163 09/17/2021   TRIG 197 (H) 09/17/2021   HDL 62 09/17/2021   LDLCALC 69 09/17/2021   ALT 11 09/17/2021   AST 7 09/17/2021   NA 137 09/17/2021   K 4.6 09/17/2021   CL 101 09/17/2021   CREATININE 0.91 09/17/2021   BUN 16 09/17/2021   CO2 23 09/17/2021   TSH 3.930 09/17/2021      Assessment & Plan:   Problem List Items Addressed This Visit       Cardiovascular and Mediastinum   Supraventricular tachycardia (HCC) - Primary Continue current meds     Other   Mixed hyperlipidemia Continue crestor and watch diet   Anxiety Continue current meds  .  No orders of the defined types were placed in this encounter.   No orders of the defined types were placed in this encounter.    Follow-up: Return in about 6 months (around 03/23/2022) for fasting follow up.  An After Visit Summary was printed and given to the patient.  03/25/2022 [facility] [phone]

## 2021-10-02 ENCOUNTER — Ambulatory Visit: Payer: Managed Care, Other (non HMO) | Admitting: Cardiology

## 2021-10-02 ENCOUNTER — Encounter: Payer: Self-pay | Admitting: Cardiology

## 2021-10-02 ENCOUNTER — Other Ambulatory Visit: Payer: Self-pay | Admitting: Physician Assistant

## 2021-10-02 VITALS — BP 114/72 | HR 57 | Ht 67.0 in | Wt 173.4 lb

## 2021-10-02 DIAGNOSIS — R002 Palpitations: Secondary | ICD-10-CM

## 2021-10-02 DIAGNOSIS — F419 Anxiety disorder, unspecified: Secondary | ICD-10-CM

## 2021-10-02 DIAGNOSIS — I2584 Coronary atherosclerosis due to calcified coronary lesion: Secondary | ICD-10-CM

## 2021-10-02 DIAGNOSIS — I2510 Atherosclerotic heart disease of native coronary artery without angina pectoris: Secondary | ICD-10-CM

## 2021-10-02 DIAGNOSIS — R0789 Other chest pain: Secondary | ICD-10-CM | POA: Diagnosis not present

## 2021-10-02 DIAGNOSIS — I471 Supraventricular tachycardia: Secondary | ICD-10-CM | POA: Diagnosis not present

## 2021-10-02 NOTE — Progress Notes (Unsigned)
Cardiology Office Note:    Date:  10/02/2021   ID:  Jeanne Jacobson, DOB [birth year], MRN [id number]  PCP:  Marianne Sofia, PA-C  Cardiologist:  Gypsy Balsam, MD    Referring MD: Marianne Sofia, PA-C   Chief Complaint  Patient presents with   Medication Refill    All cardiac meds     History of Present Illness:    Jeanne Jacobson is a 51 y.o. female with past medical history significant for supraventricular tachycardia that being successfully suppressed with metoprolol 50 twice daily tartrate release.  She also got elevated calcium score of 20.  Dyslipidemia.  Comes today to my for follow-up overall doing very well.  Denies any chest pain tightness squeezing pressure burning chest no palpitations dizziness swelling of lower extremities.  Overall doing well.  Past Medical History:  Diagnosis Date   Anxiety    Hyperlipidemia     Past Surgical History:  Procedure Laterality Date   ABDOMINAL HYSTERECTOMY     OOPHORECTOMY Right    TONSILLECTOMY     TUBAL LIGATION      Current Medications: Current Meds  Medication Sig   citalopram (CELEXA) 10 MG tablet TAKE 1 TABLET DAILY   estradiol (ESTRACE) 2 MG tablet TAKE 1 TABLET DAILY (Patient taking differently: Take 2 mg by mouth daily.)   gabapentin (NEURONTIN) 100 MG capsule Take 1 capsule (100 mg total) by mouth 2 (two) times daily.   meloxicam (MOBIC) 7.5 MG tablet Take 7.5 mg by mouth daily.   metoprolol tartrate (LOPRESSOR) 50 MG tablet Take 1 tablet (50 mg total) by mouth in the morning, at noon, and at bedtime.   rosuvastatin (CRESTOR) 10 MG tablet TAKE 1 TABLET DAILY (Patient taking differently: Take 10 mg by mouth daily.)     Allergies:   Penicillins   Social History   Socioeconomic History   Marital status: Married    Spouse name: Not on file   Number of children: 2   Years of education: Not on file   Highest education level: Not on file  Occupational History   Occupation: Sports administrator  Tobacco Use   Smoking status:  Never   Smokeless tobacco: Never  Vaping Use   Vaping Use: Never used  Substance and Sexual Activity   Alcohol use: Yes    Comment: social   Drug use: Never   Sexual activity: Not on file  Other Topics Concern   Not on file  Social History Narrative   Not on file   Social Determinants of Health   Financial Resource Strain: Not on file  Food Insecurity: Not on file  Transportation Needs: Not on file  Physical Activity: Not on file  Stress: Not on file  Social Connections: Not on file     Family History: The patient's family history includes Coronary artery disease in her father. ROS:   Please see the history of present illness.    All 14 point review of systems negative except as described per history of present illness  EKGs/Labs/Other Studies Reviewed:      Recent Labs: 09/17/2021: ALT 11; BUN 16; Creatinine, Ser 0.91; Hemoglobin 11.7; Platelets 200; Potassium 4.6; Sodium 137; TSH 3.930  Recent Lipid Panel    Component Value Date/Time   CHOL 163 09/17/2021 0927   TRIG 197 (H) 09/17/2021 0927   HDL 62 09/17/2021 0927   CHOLHDL 2.6 09/17/2021 0927   LDLCALC 69 09/17/2021 0927    Physical Exam:    VS:  BP 114/72 (BP Location: Left  Arm, Patient Position: Sitting)   Pulse (!) 57   Ht 5\' 7"  (1.702 m)   Wt 173 lb 6.4 oz (78.7 kg)   SpO2 95%   BMI 27.16 kg/m     Wt Readings from Last 3 Encounters:  10/02/21 173 lb 6.4 oz (78.7 kg)  09/20/21 174 lb 9.6 oz (79.2 kg)  06/28/21 168 lb 12.8 oz (76.6 kg)     GEN:  Well nourished, well developed in no acute distress HEENT: Normal NECK: No JVD; No carotid bruits LYMPHATICS: No lymphadenopathy CARDIAC: RRR, no murmurs, no rubs, no gallops RESPIRATORY:  Clear to auscultation without rales, wheezing or rhonchi  ABDOMEN: Soft, non-tender, non-distended MUSCULOSKELETAL:  No edema; No deformity  SKIN: Warm and dry LOWER EXTREMITIES: no swelling NEUROLOGIC:  Alert and oriented x 3 PSYCHIATRIC:  Normal affect    ASSESSMENT:    1. Supraventricular tachycardia (HCC)   2. Palpitations   3. Atypical chest pain   4. Coronary artery calcification calcium score 20 in 2023    PLAN:    In order of problems listed above:  Supraventricular tachycardia stable on appropriate medications which I will continue.  She is very happy with the results of her therapy Dyslipidemia I did review K PN which show LDL of 69 HDL 62 excellent cholesterol profile on Crestor 10 which I will continue Calcification of the coronary artery risk factors modifications   Medication Adjustments/Labs and Tests Ordered: Current medicines are reviewed at length with the patient today.  Concerns regarding medicines are outlined above.  No orders of the defined types were placed in this encounter.  Medication changes: No orders of the defined types were placed in this encounter.   Signed, 2024, MD, [facility] 10/02/2021 4:25 PM    [facility]

## 2021-10-02 NOTE — Patient Instructions (Signed)

## 2021-10-22 ENCOUNTER — Other Ambulatory Visit: Payer: Self-pay

## 2021-10-22 MED ORDER — METOPROLOL TARTRATE 50 MG PO TABS: 50.0000 mg | ORAL_TABLET | Freq: Three times a day (TID) | ORAL | 3 refills | Status: DC

## 2021-11-02 ENCOUNTER — Other Ambulatory Visit: Payer: Self-pay | Admitting: Physician Assistant

## 2021-11-07 ENCOUNTER — Other Ambulatory Visit: Payer: Self-pay | Admitting: Physician Assistant

## 2021-11-13 ENCOUNTER — Other Ambulatory Visit: Payer: Self-pay | Admitting: Physician Assistant

## 2022-01-29 ENCOUNTER — Ambulatory Visit (INDEPENDENT_AMBULATORY_CARE_PROVIDER_SITE_OTHER): Payer: BC Managed Care – PPO | Admitting: Physician Assistant

## 2022-01-29 ENCOUNTER — Encounter: Payer: Self-pay | Admitting: Physician Assistant

## 2022-01-29 VITALS — BP 110/72 | HR 61 | Temp 97.3°F | Ht 67.0 in | Wt 179.2 lb

## 2022-01-29 DIAGNOSIS — J069 Acute upper respiratory infection, unspecified: Secondary | ICD-10-CM

## 2022-01-29 LAB — POC COVID19 BINAXNOW: SARS Coronavirus 2 Ag: NEGATIVE

## 2022-01-29 MED ORDER — AZITHROMYCIN 250 MG PO TABS: ORAL_TABLET | ORAL | 0 refills | Status: AC

## 2022-01-29 MED ORDER — FLUTICASONE PROPIONATE 50 MCG/ACT NA SUSP: 2.0000 | Freq: Every day | NASAL | 6 refills | Status: DC

## 2022-01-29 NOTE — Progress Notes (Signed)
Acute Office Visit  Subjective:    Patient ID: Jeanne Jacobson, female    DOB: 1970-05-06, 51 y.o.   MRN: [id number]  Chief Complaint  Patient presents with   URI    HPI: Patient is in today for complaints of sinus pressure, pnd and mild sinus headache.  She has had no fever and only mild malaise -   Past Medical History:  Diagnosis Date   Anxiety    Hyperlipidemia     Past Surgical History:  Procedure Laterality Date   ABDOMINAL HYSTERECTOMY     OOPHORECTOMY Right    TONSILLECTOMY     TUBAL LIGATION      Family History  Problem Relation Age of Onset   Coronary artery disease Father     Social History   Socioeconomic History   Marital status: Married    Spouse name: Not on file   Number of children: 2   Years of education: Not on file   Highest education level: Not on file  Occupational History   Occupation: Tax adviser  Tobacco Use   Smoking status: Never   Smokeless tobacco: Never  Vaping Use   Vaping Use: Never used  Substance and Sexual Activity   Alcohol use: Yes    Comment: social   Drug use: Never   Sexual activity: Not on file  Other Topics Concern   Not on file  Social History Narrative   Not on file   Social Determinants of Health   Financial Resource Strain: Not on file  Food Insecurity: Not on file  Transportation Needs: Not on file  Physical Activity: Not on file  Stress: Not on file  Social Connections: Not on file  Intimate Partner Violence: Not on file    Outpatient Medications Prior to Visit  Medication Sig Dispense Refill   citalopram (CELEXA) 10 MG tablet TAKE 1 TABLET DAILY 90 tablet 3   estradiol (ESTRACE) 2 MG tablet Take 1 tablet (2 mg total) by mouth daily. 90 tablet 1   gabapentin (NEURONTIN) 100 MG capsule TAKE 1 CAPSULE TWICE A DAY 180 capsule 0   meloxicam (MOBIC) 7.5 MG tablet Take 7.5 mg by mouth daily.     metoprolol tartrate (LOPRESSOR) 50 MG tablet Take 1 tablet (50 mg total) by mouth in the morning, at noon,  and at bedtime. 180 tablet 3   rosuvastatin (CRESTOR) 10 MG tablet TAKE 1 TABLET DAILY 90 tablet 3   No facility-administered medications prior to visit.    Allergies  Allergen Reactions   Penicillins     Review of Systems CONSTITUTIONAL: Negative for chills, fatigue, fever,  E/N/T:see HPI CARDIOVASCULAR: Negative for chest pain, dizziness,  RESPIRATORY: see HPI GASTROINTESTINAL: Negative for abdominal pain, acid reflux symptoms, constipation, diarrhea, nausea and vomiting.      Objective:  PHYSICAL EXAM:   VS: BP 110/72 (BP Location: Left Arm, Patient Position: Sitting, Cuff Size: Large)   Pulse 61   Temp (!) 97.3 F (36.3 C) (Temporal)   Ht _0  (1.702 m)   Wt 179 lb 3.2 oz (81.3 kg)   SpO2 99%   BMI 28.07 kg/m   GEN: Well nourished, well developed, in no acute distress  HEENT: normal external ears and nose - normal external auditory canals and TMS -  - Lips, Teeth and Gums - normal  Oropharynx - erythema/pnd  Cardiac: RRR; no murmurs, Respiratory:  normal respiratory rate and pattern with no distress - normal breath sounds with no rales, rhonchi, wheezes or rubs  Office Visit on 01/29/2022  Component Date Value Ref Range Status   SARS Coronavirus 2 Ag 01/29/2022 Negative  Negative Final    Health Maintenance Due  Topic Date Due   COLONOSCOPY (Pts 45-8yr Insurance coverage will need to be confirmed)  Never done   Zoster Vaccines- Shingrix (1 of 2) Never done   INFLUENZA VACCINE  10/09/2021   COVID-19 Vaccine (3 - 2023-24 season) 11/09/2021    There are no preventive care reminders to display for this patient.   Lab Results  Component Value Date   TSH 3.930 09/17/2021   Lab Results  Component Value Date   WBC 5.3 09/17/2021   HGB 11.7 09/17/2021   HCT 35.9 09/17/2021   MCV 92 09/17/2021   PLT 200 09/17/2021   Lab Results  Component Value Date   NA 137 09/17/2021   K 4.6 09/17/2021   CO2 23 09/17/2021   GLUCOSE 89 09/17/2021   BUN 16  09/17/2021   CREATININE 0.91 09/17/2021   BILITOT 0.3 09/17/2021   ALKPHOS 53 09/17/2021   AST 7 09/17/2021   ALT 11 09/17/2021   PROT 6.5 09/17/2021   ALBUMIN 4.2 09/17/2021   CALCIUM 9.2 09/17/2021   EGFR 76 09/17/2021   Lab Results  Component Value Date   CHOL 163 09/17/2021   Lab Results  Component Value Date   HDL 62 09/17/2021   Lab Results  Component Value Date   LDLCALC 69 09/17/2021   Lab Results  Component Value Date   TRIG 197 (H) 09/17/2021   Lab Results  Component Value Date   CHOLHDL 2.6 09/17/2021   No results found for: "HGBA1C"     Assessment & Plan:   Problem List Items Addressed This Visit   None Visit Diagnoses     Acute upper respiratory infection    -  Primary   Relevant Medications   azithromycin (ZITHROMAX) 250 MG tablet   fluticasone (FLONASE) 50 MCG/ACT nasal spray   Other Relevant Orders   POC COVID-19 BinaxNow (Completed) Recommend otc mucinex      Meds ordered this encounter  Medications   azithromycin (ZITHROMAX) 250 MG tablet    Sig: Take 2 tablets on day 1, then 1 tablet daily on days 2 through 5    Dispense:  6 tablet    Refill:  0    Order Specific Question:   Supervising Provider    Answer:   COX, KElnita Maxwell[[[id number]]  fluticasone (FLONASE) 50 MCG/ACT nasal spray    Sig: Place 2 sprays into both nostrils daily.    Dispense:  16 g    Refill:  6    Order Specific Question:   Supervising Provider    Answer:[name]   Orders Placed This Encounter  Procedures   POC COVID-19 BinaxNow     Follow-up: Return if symptoms worsen or fail to improve.  An After Visit Summary was printed and given to the patient.  [name] [phone]

## 2022-02-25 ENCOUNTER — Other Ambulatory Visit: Payer: Self-pay | Admitting: Physician Assistant

## 2022-03-20 ENCOUNTER — Other Ambulatory Visit: Payer: Self-pay | Admitting: Physician Assistant

## 2022-03-20 DIAGNOSIS — I471 Supraventricular tachycardia, unspecified: Secondary | ICD-10-CM

## 2022-03-20 DIAGNOSIS — M8588 Other specified disorders of bone density and structure, other site: Secondary | ICD-10-CM

## 2022-03-20 DIAGNOSIS — E782 Mixed hyperlipidemia: Secondary | ICD-10-CM

## 2022-03-21 ENCOUNTER — Other Ambulatory Visit: Payer: Managed Care, Other (non HMO)

## 2022-03-25 ENCOUNTER — Encounter: Payer: Self-pay | Admitting: Physician Assistant

## 2022-03-25 ENCOUNTER — Ambulatory Visit (INDEPENDENT_AMBULATORY_CARE_PROVIDER_SITE_OTHER): Payer: PRIVATE HEALTH INSURANCE | Admitting: Physician Assistant

## 2022-03-25 ENCOUNTER — Other Ambulatory Visit: Payer: Self-pay

## 2022-03-25 VITALS — BP 110/60 | HR 65 | Temp 97.1°F | Ht 67.0 in | Wt 181.2 lb

## 2022-03-25 DIAGNOSIS — M5441 Lumbago with sciatica, right side: Secondary | ICD-10-CM | POA: Diagnosis not present

## 2022-03-25 DIAGNOSIS — Z23 Encounter for immunization: Secondary | ICD-10-CM | POA: Diagnosis not present

## 2022-03-25 DIAGNOSIS — G8929 Other chronic pain: Secondary | ICD-10-CM

## 2022-03-25 DIAGNOSIS — I471 Supraventricular tachycardia, unspecified: Secondary | ICD-10-CM | POA: Diagnosis not present

## 2022-03-25 DIAGNOSIS — E782 Mixed hyperlipidemia: Secondary | ICD-10-CM

## 2022-03-25 DIAGNOSIS — F419 Anxiety disorder, unspecified: Secondary | ICD-10-CM

## 2022-03-25 DIAGNOSIS — Z1211 Encounter for screening for malignant neoplasm of colon: Secondary | ICD-10-CM

## 2022-03-25 NOTE — Progress Notes (Signed)
Subjective:  Patient ID: Jeanne Jacobson, female    DOB: 1970/09/30  Age: 52 y.o. MRN: [id number]  Chief Complaint  Patient presents with   Hyperlipidemia    HPI  Mixed hyperlipidemia  Pt presents with hyperlipidemia. . Compliance with treatment has been good -The patient is compliant with medications, maintains a low cholesterol diet , follows up as directed , and maintains an exercise regimen . The patient denies experiencing any hypercholesterolemia related symptoms. Currently taking crestor 10mg  qd  Pt with history of SVT - follows with cardiology and next appt in June or July.  She states her symptoms are currently controlled on lopressor 50mg  qd Denies chest pain or shortness of breath  Pt with history of chronic low back pain with intermittent sciatica - she uses neurontin and meloxicam which are helpful for symptoms  Pt with history of anxiety - states symptoms are controlled well with citalopram 10mg  qd - she is not having to use ativan anymore  Pt would like flu shot  Pt would like referral for colonoscopy  Current Outpatient Medications on File Prior to Visit  Medication Sig Dispense Refill   citalopram (CELEXA) 10 MG tablet TAKE 1 TABLET DAILY 90 tablet 3   estradiol (ESTRACE) 2 MG tablet Take 1 tablet (2 mg total) by mouth daily. 90 tablet 1   fluticasone (FLONASE) 50 MCG/ACT nasal spray Place 2 sprays into both nostrils daily. 16 g 6   gabapentin (NEURONTIN) 100 MG capsule TAKE 1 CAPSULE TWICE A DAY 180 capsule 0   meloxicam (MOBIC) 7.5 MG tablet Take 7.5 mg by mouth daily.     metoprolol tartrate (LOPRESSOR) 50 MG tablet Take 1 tablet (50 mg total) by mouth in the morning, at noon, and at bedtime. 180 tablet 3   rosuvastatin (CRESTOR) 10 MG tablet TAKE 1 TABLET DAILY 90 tablet 3   No current facility-administered medications on file prior to visit.   Past Medical History:  Diagnosis Date   Anxiety    Hyperlipidemia    Past Surgical History:  Procedure  Laterality Date   ABDOMINAL HYSTERECTOMY     OOPHORECTOMY Right    TONSILLECTOMY     TUBAL LIGATION      Family History  Problem Relation Age of Onset   Coronary artery disease Father    Social History   Socioeconomic History   Marital status: Married    Spouse name: Not on file   Number of children: 2   Years of education: Not on file   Highest education level: Not on file  Occupational History   Occupation: Tax adviser  Tobacco Use   Smoking status: Never   Smokeless tobacco: Never  Vaping Use   Vaping Use: Never used  Substance and Sexual Activity   Alcohol use: Yes    Comment: social   Drug use: Never   Sexual activity: Not on file  Other Topics Concern   Not on file  Social History Narrative   Not on file   Social Determinants of Health   Financial Resource Strain: Not on file  Food Insecurity: Not on file  Transportation Needs: Not on file  Physical Activity: Not on file  Stress: Not on file  Social Connections: Not on file    Review of Systems CONSTITUTIONAL: Negative for chills, fatigue, fever, unintentional weight gain and unintentional weight loss.  E/N/T: Negative for ear pain, nasal congestion and sore throat.  CARDIOVASCULAR: Negative for chest pain, dizziness, palpitations and pedal edema.  RESPIRATORY: Negative for  recent cough and dyspnea.  GASTROINTESTINAL: Negative for abdominal pain, acid reflux symptoms, constipation, diarrhea, nausea and vomiting.  MSK: Negative for arthralgias and myalgias.  INTEGUMENTARY: Negative for rash.  NEUROLOGICAL: Negative for dizziness and headaches.  PSYCHIATRIC: Negative for sleep disturbance and to question depression screen.  Negative for depression, negative for anhedonia.       Objective:  PHYSICAL EXAM:   VS: BP 110/60 (BP Location: Right Arm, Patient Position: Sitting, Cuff Size: Large)   Pulse 65   Temp (!) 97.1 F (36.2 C) (Temporal)   Ht 5\' 7"  (1.702 m)   Wt 181 lb 3.2 oz (82.2 kg)   SpO2 98%    BMI 28.38 kg/m   GEN: Well nourished, well developed, in no acute distress  Cardiac: RRR; no murmurs,  Respiratory:  normal respiratory rate and pattern with no distress - normal breath sounds with no rales, rhonchi, wheezes or rubs MS: no deformity or atrophy  Skin: warm and dry, no rash  Psych: euthymic mood, appropriate affect and demeanor  Lab Results  Component Value Date   WBC 5.3 09/17/2021   HGB 11.7 09/17/2021   HCT 35.9 09/17/2021   PLT 200 09/17/2021   GLUCOSE 89 09/17/2021   CHOL 163 09/17/2021   TRIG 197 (H) 09/17/2021   HDL 62 09/17/2021   LDLCALC 69 09/17/2021   ALT 11 09/17/2021   AST 7 09/17/2021   NA 137 09/17/2021   K 4.6 09/17/2021   CL 101 09/17/2021   CREATININE 0.91 09/17/2021   BUN 16 09/17/2021   CO2 23 09/17/2021   TSH 3.930 09/17/2021      Assessment & Plan:   Hadar was seen today for hyperlipidemia.  Mixed hyperlipidemia Labwork done earlier today Continue meds Supraventricular tachycardia Continue med Follow up with cardiology as directed Anxiety Continue meds Chronic right-sided low back pain with right-sided sciatica Continue meds Needs flu shot -     Flu Vaccine MDCK QUAD PF  Colon cancer screening -     Ambulatory referral to Gastroenterology     No orders of the defined types were placed in this encounter.   Orders Placed This Encounter  Procedures   Flu Vaccine MDCK QUAD PF   Ambulatory referral to Gastroenterology     Follow-up: Return in about 6 months (around 09/23/2022) for chronic fasting follow up.  An After Visit Summary was printed and given to the patient.  Yetta Flock [facility] [phone]

## 2022-03-26 LAB — CBC WITH DIFFERENTIAL/PLATELET
Basophils Absolute: 0 10*3/uL (ref 0.0–0.2)
Basos: 1 %
EOS (ABSOLUTE): 0.1 10*3/uL (ref 0.0–0.4)
Eos: 2 %
Hematocrit: 37.4 % (ref 34.0–46.6)
Hemoglobin: 12.6 g/dL (ref 11.1–15.9)
Immature Grans (Abs): 0 10*3/uL (ref 0.0–0.1)
Immature Granulocytes: 0 %
Lymphocytes Absolute: 2.7 10*3/uL (ref 0.7–3.1)
Lymphs: 47 %
MCH: 30.7 pg (ref 26.6–33.0)
MCHC: 33.7 g/dL (ref 31.5–35.7)
MCV: 91 fL (ref 79–97)
Monocytes Absolute: 0.4 10*3/uL (ref 0.1–0.9)
Monocytes: 7 %
Neutrophils Absolute: 2.5 10*3/uL (ref 1.4–7.0)
Neutrophils: 43 %
Platelets: 211 10*3/uL (ref 150–450)
RBC: 4.1 x10E6/uL (ref 3.77–5.28)
RDW: 11.3 % — ABNORMAL LOW (ref 11.7–15.4)
WBC: 5.7 10*3/uL (ref 3.4–10.8)

## 2022-03-26 LAB — COMPREHENSIVE METABOLIC PANEL
ALT: 12 IU/L (ref 0–32)
AST: 12 IU/L (ref 0–40)
Albumin/Globulin Ratio: 1.7 (ref 1.2–2.2)
Albumin: 4.4 g/dL (ref 3.8–4.9)
Alkaline Phosphatase: 57 IU/L (ref 44–121)
BUN/Creatinine Ratio: 21 (ref 9–23)
BUN: 19 mg/dL (ref 6–24)
Bilirubin Total: 0.3 mg/dL (ref 0.0–1.2)
CO2: 23 mmol/L (ref 20–29)
Calcium: 9.3 mg/dL (ref 8.7–10.2)
Chloride: 101 mmol/L (ref 96–106)
Creatinine, Ser: 0.91 mg/dL (ref 0.57–1.00)
Globulin, Total: 2.6 g/dL (ref 1.5–4.5)
Glucose: 88 mg/dL (ref 70–99)
Potassium: 4.8 mmol/L (ref 3.5–5.2)
Sodium: 137 mmol/L (ref 134–144)
Total Protein: 7 g/dL (ref 6.0–8.5)
eGFR: 76 mL/min/{1.73_m2} (ref >59)

## 2022-03-26 LAB — LIPID PANEL
Chol/HDL Ratio: 2.8 ratio (ref 0.0–4.4)
Cholesterol, Total: 181 mg/dL (ref 100–199)
HDL: 64 mg/dL (ref >39)
LDL Chol Calc (NIH): 79 mg/dL (ref 0–99)
Triglycerides: 230 mg/dL — ABNORMAL HIGH (ref 0–149)
VLDL Cholesterol Cal: 38 mg/dL (ref 5–40)

## 2022-03-26 LAB — CARDIOVASCULAR RISK ASSESSMENT

## 2022-03-26 LAB — TSH: TSH: 4.05 u[IU]/mL (ref 0.450–4.500)

## 2022-03-28 IMAGING — CT CT CARDIAC CORONARY ARTERY CALCIUM SCORE
3 series · 14 of 20 positions shown, 16 images · non-contrast
Comparison: None.
COMPARISON: None.

Addendum:
EXAM:
OVER-READ INTERPRETATION  CT CHEST

The following report is an over-read performed by radiologist Dr.
over-read does not include interpretation of cardiac or coronary
anatomy or pathology. The coronary calcium score interpretation by
the cardiologist is attached.
CLINICAL DATA: Risk stratification
Coronary Calcium Score
TECHNIQUE: The patient was scanned on a Siemens Force scanner. Axial
non-contrast 3 mm slices were carried out through the heart. The
data set was analyzed on a dedicated work station and scored using
the Agatson method.

[Series 2: cascseq 2.0 sa36 70% (id) · axial · 0.39mm/px · z∈[-230,-140]mm · 4 of 76 slices shown]
[im 16/76  vessel]
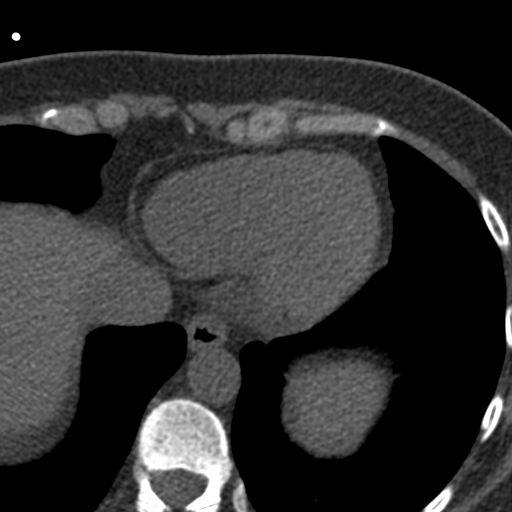
[im 31/76  vessel]
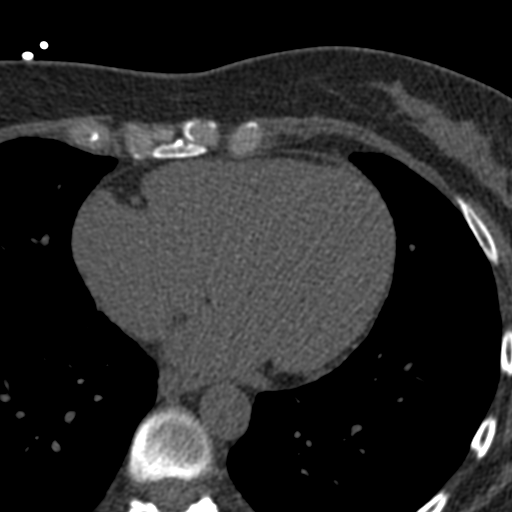
[im 46/76  vessel]
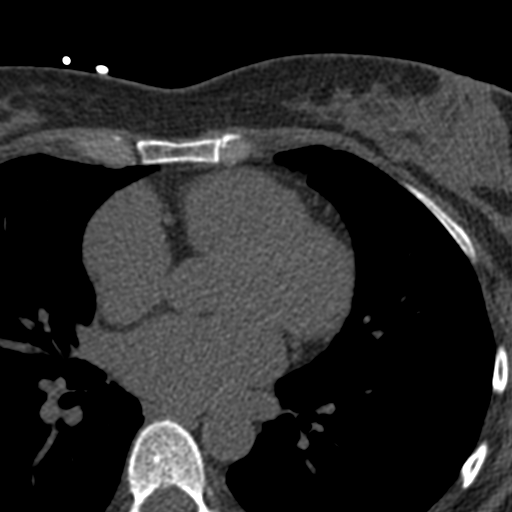
[im 61/76  vessel]
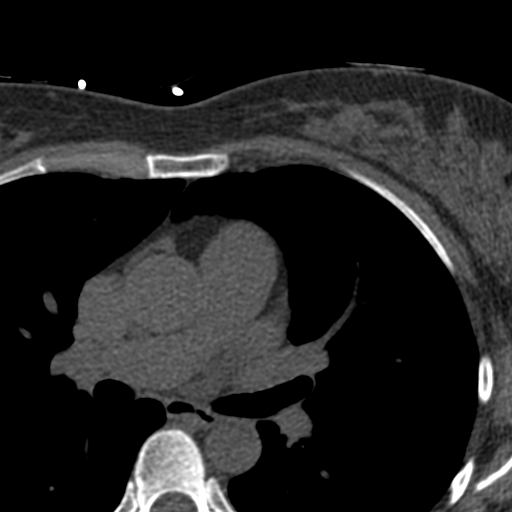

[Series 3: cascseq 2.0 bf37 st · axial · 0.69mm/px · z∈[-236,-136]mm · 5 of 76 slices shown, 7 images]
[im 13/76  vessel]
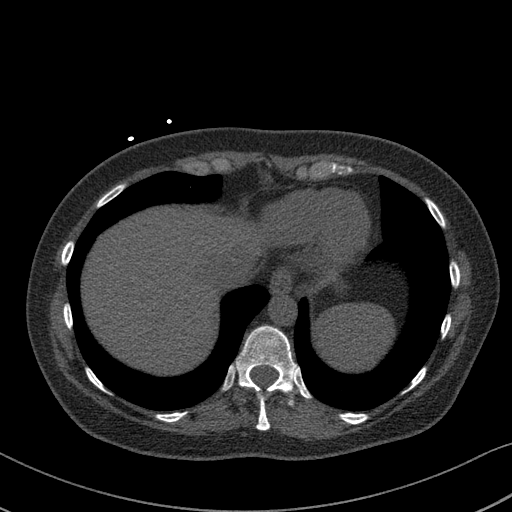
[im 13/76  lung]
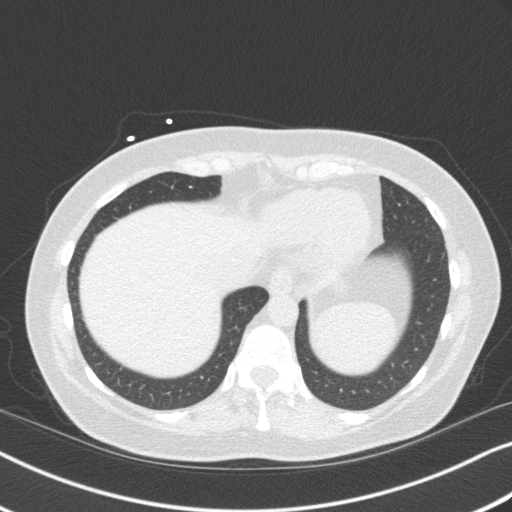
[im 26/76  vessel]
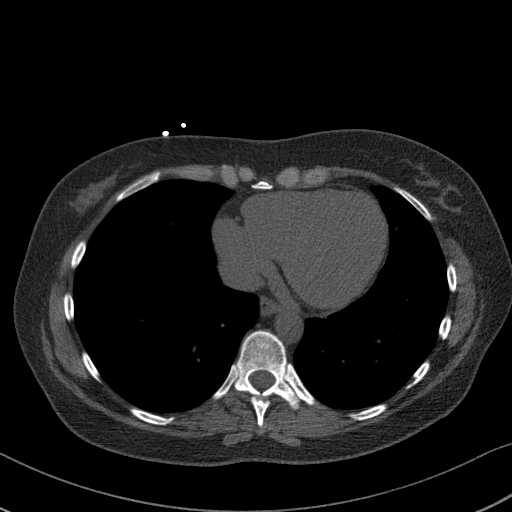
[im 38/76  vessel]
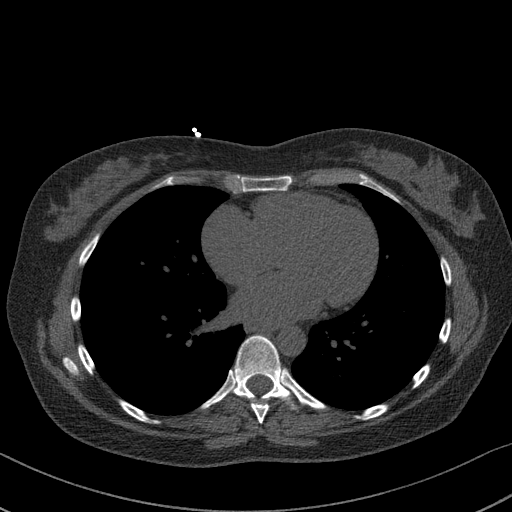
[im 51/76  vessel]
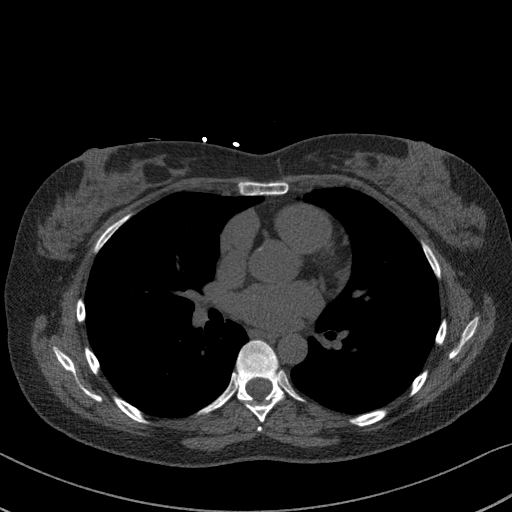
[im 63/76  vessel]
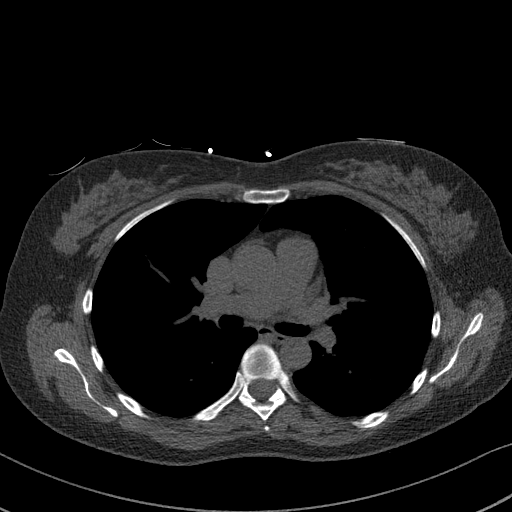
[im 63/76  lung]
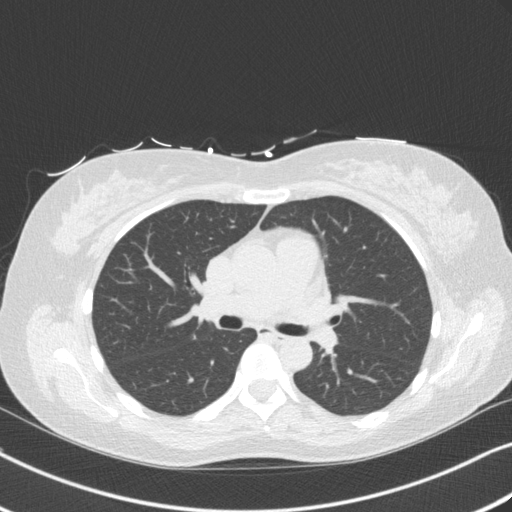

[Series 4: cascseq 2.0 br59 lung · axial · 0.69mm/px · z∈[-236,-136]mm · 5 of 76 slices shown]
[im 13/76  lung]
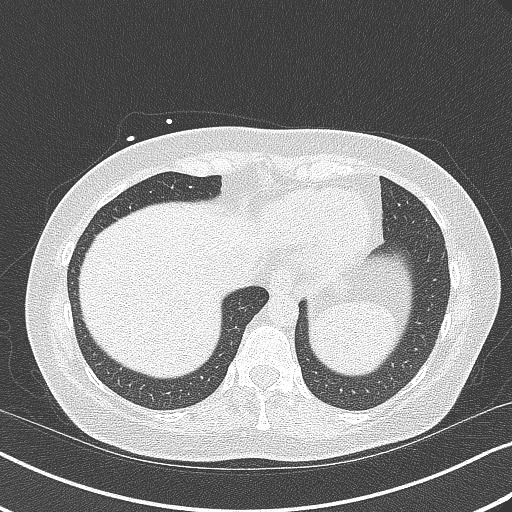
[im 26/76  lung]
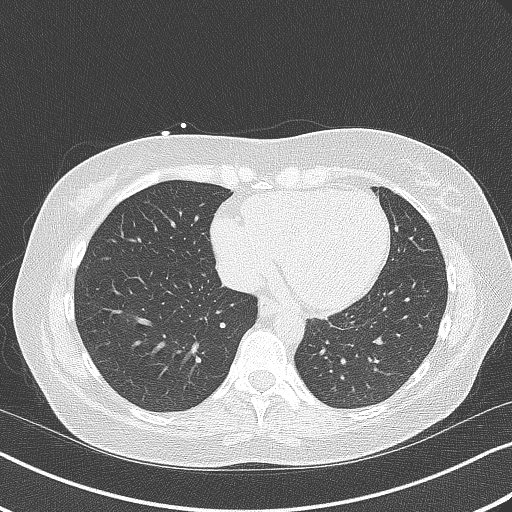
[im 38/76  lung]
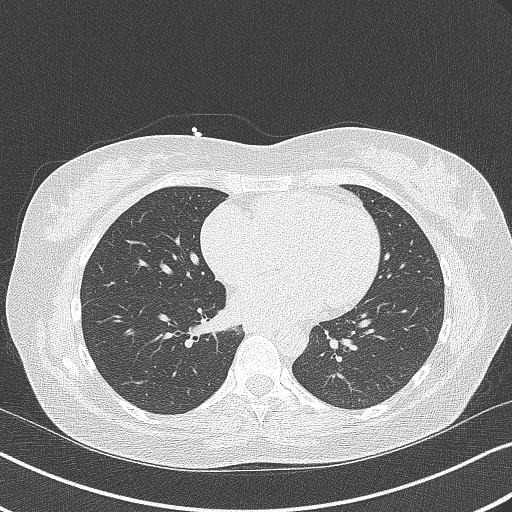
[im 51/76  lung]
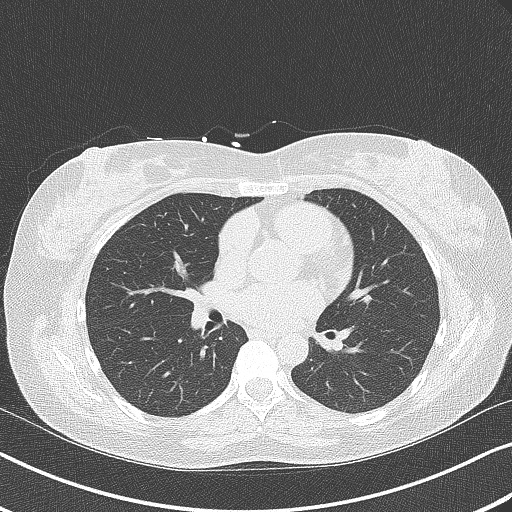
[im 63/76  lung]
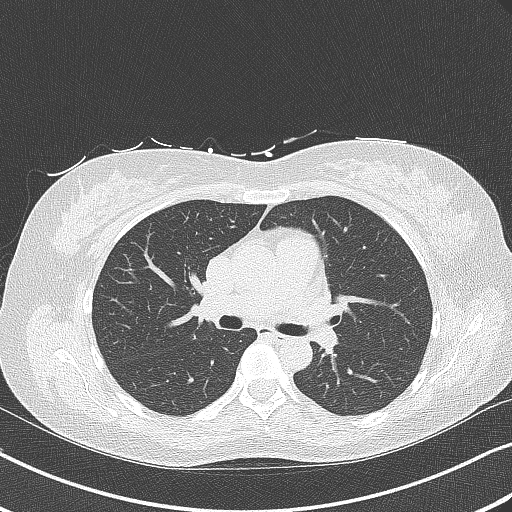

[14 of 20 positions shown; findings below may reference images not displayed]

FINDINGS: Vascular: No significant noncardiac vascular findings.

Mediastinum/Nodes: Visualized mediastinum and hilar regions
demonstrate no lymphadenopathy or masses.

Lungs/Pleura: Visualized lungs show no evidence of pulmonary edema,
consolidation, pneumothorax, nodule or pleural fluid.

Upper Abdomen: No acute abnormality.

Musculoskeletal: Mild rightward convex scoliosis of the thoracic
spine.
IMPRESSION: Mild thoracic scoliosis.
FINDINGS: Non-cardiac: See separate report from [REDACTED].

Ascending Aorta: Small calcifications noted in the ascending aortae
and aortic arch.

Pericardium: Normal

Coronary arteries: Normal origin
IMPRESSION: Coronary calcium score of 24.2 - LAD. This was 92 percentile for age
and sex matched control.

*** End of Addendum ***
EXAM:
OVER-READ INTERPRETATION  CT CHEST

The following report is an over-read performed by radiologist Dr.
over-read does not include interpretation of cardiac or coronary
anatomy or pathology. The coronary calcium score interpretation by
the cardiologist is attached.
FINDINGS: Vascular: No significant noncardiac vascular findings.

Mediastinum/Nodes: Visualized mediastinum and hilar regions
demonstrate no lymphadenopathy or masses.

Lungs/Pleura: Visualized lungs show no evidence of pulmonary edema,
consolidation, pneumothorax, nodule or pleural fluid.

Upper Abdomen: No acute abnormality.

Musculoskeletal: Mild rightward convex scoliosis of the thoracic
spine.
IMPRESSION: Mild thoracic scoliosis.

## 2022-04-04 ENCOUNTER — Telehealth: Payer: Self-pay

## 2022-04-04 NOTE — Telephone Encounter (Signed)
Patient called and left voicemail needing a refill.  Called patient back got no answer, unable to leave voicemail.

## 2022-04-08 ENCOUNTER — Encounter: Payer: Self-pay | Admitting: Physician Assistant

## 2022-04-11 ENCOUNTER — Other Ambulatory Visit: Payer: Self-pay | Admitting: Physician Assistant

## 2022-04-22 ENCOUNTER — Encounter: Payer: Self-pay | Admitting: Physician Assistant

## 2022-04-22 ENCOUNTER — Ambulatory Visit (INDEPENDENT_AMBULATORY_CARE_PROVIDER_SITE_OTHER): Payer: PRIVATE HEALTH INSURANCE | Admitting: Physician Assistant

## 2022-04-22 VITALS — BP 108/68 | HR 69 | Temp 97.6°F | Ht 67.0 in | Wt 181.6 lb

## 2022-04-22 DIAGNOSIS — J060 Acute laryngopharyngitis: Secondary | ICD-10-CM | POA: Diagnosis not present

## 2022-04-22 MED ORDER — AZITHROMYCIN 250 MG PO TABS: ORAL_TABLET | ORAL | 0 refills | Status: AC

## 2022-04-22 MED ORDER — PREDNISONE 20 MG PO TABS: ORAL_TABLET | ORAL | 0 refills | Status: AC

## 2022-04-22 NOTE — Progress Notes (Signed)
Acute Office Visit  Subjective:    Patient ID: Jeanne Jacobson, female    DOB: 1970/11/06, 52 y.o.   MRN: [id number]  Chief Complaint  Patient presents with   Cough   Sinusitis    HPI Patient is in today for complaints of cough, cold and congestion - she also complains of left ear pain - she was seen at [facility] and given rx for omnicef and tessalon - states she is still having ear feeling stopped up and sinus drainage  Past Medical History:  Diagnosis Date   Anxiety    Hyperlipidemia     Past Surgical History:  Procedure Laterality Date   ABDOMINAL HYSTERECTOMY     OOPHORECTOMY Right    TONSILLECTOMY     TUBAL LIGATION      Family History  Problem Relation Age of Onset   Coronary artery disease Father     Social History   Socioeconomic History   Marital status: Married    Spouse name: Not on file   Number of children: 2   Years of education: Not on file   Highest education level: Not on file  Occupational History   Occupation: Tax adviser  Tobacco Use   Smoking status: Never   Smokeless tobacco: Never  Vaping Use   Vaping Use: Never used  Substance and Sexual Activity   Alcohol use: Yes    Comment: social   Drug use: Never   Sexual activity: Not on file  Other Topics Concern   Not on file  Social History Narrative   Not on file   Social Determinants of Health   Financial Resource Strain: Not on file  Food Insecurity: Not on file  Transportation Needs: Not on file  Physical Activity: Not on file  Stress: Not on file  Social Connections: Not on file  Intimate Partner Violence: Not on file     Current Outpatient Medications:    azithromycin (ZITHROMAX) 250 MG tablet, Take 2 tablets on day 1, then 1 tablet daily on days 2 through 5, Disp: 6 tablet, Rfl: 0   benzonatate (TESSALON) 100 MG capsule, Take by mouth., Disp: , Rfl:    cefdinir (OMNICEF) 300 MG capsule, Take by mouth., Disp: , Rfl:    citalopram (CELEXA) 10 MG tablet, TAKE 1 TABLET  DAILY, Disp: 90 tablet, Rfl: 3   estradiol (ESTRACE) 2 MG tablet, Take 1 tablet (2 mg total) by mouth daily., Disp: 90 tablet, Rfl: 1   fluticasone (FLONASE) 50 MCG/ACT nasal spray, Place 2 sprays into both nostrils daily., Disp: 16 g, Rfl: 6   gabapentin (NEURONTIN) 100 MG capsule, TAKE 1 CAPSULE TWICE A DAY, Disp: 180 capsule, Rfl: 0   meloxicam (MOBIC) 7.5 MG tablet, Take 7.5 mg by mouth daily., Disp: , Rfl:    metoprolol tartrate (LOPRESSOR) 50 MG tablet, Take 1 tablet (50 mg total) by mouth in the morning, at noon, and at bedtime., Disp: 180 tablet, Rfl: 3   predniSONE (DELTASONE) 20 MG tablet, Take 3 tablets (60 mg total) by mouth daily with breakfast for 3 days, THEN 2 tablets (40 mg total) daily with breakfast for 3 days, THEN 1 tablet (20 mg total) daily with breakfast for 3 days., Disp: 18 tablet, Rfl: 0   promethazine-dextromethorphan (PROMETHAZINE-DM) 6.25-15 MG/5ML syrup, Take by mouth., Disp: , Rfl:    rosuvastatin (CRESTOR) 10 MG tablet, TAKE 1 TABLET BY MOUTH EVERY DAY, Disp: 90 tablet, Rfl: 0   Allergies  Allergen Reactions   Penicillins Hives  CONSTITUTIONAL: Negative for chills, fatigue, fever,  E/N/T: see HPI CARDIOVASCULAR: Negative for chest pain,  RESPIRATORY: see HPI GASTROINTESTINAL: Negative for abdominal pain, acid reflux symptoms, constipation, diarrhea, nausea and vomiting.          Objective:    PHYSICAL EXAM:   VS: BP 108/68 (BP Location: Left Arm, Patient Position: Sitting, Cuff Size: Large)   Pulse 69   Temp 97.6 F (36.4 C) (Temporal)   Ht 5' 7"$  (1.702 m)   Wt 181 lb 9.6 oz (82.4 kg)   SpO2 99%   BMI 28.44 kg/m   GEN: Well nourished, well developed, in no acute distress  HEENT: normal external ears and nose - left TM retracted and mild erythema - right TM normal- - Lips, Teeth and Gums - normal  Oropharynx - erythema/pnd Cardiac: RRR; no murmurs,  Respiratory:  normal respiratory rate and pattern with no distress - normal breath sounds  with no rales, rhonchi, wheezes or rubs   Wt Readings from Last 3 Encounters:  04/22/22 181 lb 9.6 oz (82.4 kg)  03/25/22 181 lb 3.2 oz (82.2 kg)  01/29/22 179 lb 3.2 oz (81.3 kg)    Health Maintenance Due  Topic Date Due   COLONOSCOPY (Pts 45-73yr Insurance coverage will need to be confirmed)  Never done    There are no preventive care reminders to display for this patient.        Assessment & Plan:   Problem List Items Addressed This Visit       Respiratory   Acute laryngopharyngitis - Primary   Relevant Medications   azithromycin (ZITHROMAX) 250 MG tablet   predniSONE (DELTASONE) 20 MG tablet     Meds ordered this encounter  Medications   azithromycin (ZITHROMAX) 250 MG tablet    Sig: Take 2 tablets on day 1, then 1 tablet daily on days 2 through 5    Dispense:  6 tablet    Refill:  0    Order Specific Question:   Supervising Provider    Answer:   CShelton Silvas  predniSONE (DELTASONE) 20 MG tablet    Sig: Take 3 tablets (60 mg total) by mouth daily with breakfast for 3 days, THEN 2 tablets (40 mg total) daily with breakfast for 3 days, THEN 1 tablet (20 mg total) daily with breakfast for 3 days.    Dispense:  18 tablet    Refill:  0    Order Specific Question:   Supervising Provider    Answer:   COX, KIRSTEN [[id number]    [facility] [place] PA-C

## 2022-05-01 ENCOUNTER — Telehealth: Payer: Self-pay

## 2022-05-01 NOTE — Telephone Encounter (Signed)
Patient called she just finish Z-Pack today and is still not feeling better. Her ear is still stop up and wanted to know what she can do. Please advise.

## 2022-05-01 NOTE — Telephone Encounter (Signed)
Called patient Left detail message for patient to call office back.

## 2022-05-03 ENCOUNTER — Other Ambulatory Visit: Payer: Self-pay | Admitting: Physician Assistant

## 2022-05-03 DIAGNOSIS — H9203 Otalgia, bilateral: Secondary | ICD-10-CM

## 2022-05-03 NOTE — Telephone Encounter (Signed)
Referral to Dr Gaylyn Cheers placed

## 2022-05-03 NOTE — Telephone Encounter (Signed)
Patient returned call - has no preference for ENT - [place] or [facility] is fine.

## 2022-06-03 ENCOUNTER — Other Ambulatory Visit: Payer: Self-pay | Admitting: Physician Assistant

## 2022-06-10 ENCOUNTER — Other Ambulatory Visit: Payer: Self-pay | Admitting: Cardiology

## 2022-08-30 ENCOUNTER — Other Ambulatory Visit: Payer: Self-pay | Admitting: Physician Assistant

## 2022-09-24 LAB — HM MAMMOGRAPHY

## 2022-09-25 ENCOUNTER — Encounter: Payer: Self-pay | Admitting: Physician Assistant

## 2022-09-26 ENCOUNTER — Other Ambulatory Visit: Payer: Self-pay | Admitting: Family Medicine

## 2022-09-26 ENCOUNTER — Ambulatory Visit (INDEPENDENT_AMBULATORY_CARE_PROVIDER_SITE_OTHER): Payer: BC Managed Care – PPO | Admitting: Physician Assistant

## 2022-09-26 ENCOUNTER — Encounter: Payer: Self-pay | Admitting: Physician Assistant

## 2022-09-26 VITALS — BP 106/70 | HR 56 | Temp 97.9°F | Ht 67.0 in | Wt 185.0 lb

## 2022-09-26 DIAGNOSIS — I471 Supraventricular tachycardia, unspecified: Secondary | ICD-10-CM

## 2022-09-26 DIAGNOSIS — F419 Anxiety disorder, unspecified: Secondary | ICD-10-CM | POA: Diagnosis not present

## 2022-09-26 DIAGNOSIS — E782 Mixed hyperlipidemia: Secondary | ICD-10-CM

## 2022-09-26 DIAGNOSIS — M8588 Other specified disorders of bone density and structure, other site: Secondary | ICD-10-CM | POA: Diagnosis not present

## 2022-09-26 MED ORDER — MELOXICAM 7.5 MG PO TABS: 7.5000 mg | ORAL_TABLET | Freq: Every day | ORAL | 1 refills | Status: DC

## 2022-09-26 MED ORDER — CITALOPRAM HYDROBROMIDE 20 MG PO TABS: 20.0000 mg | ORAL_TABLET | Freq: Every day | ORAL | 1 refills | Status: DC

## 2022-09-26 NOTE — Progress Notes (Signed)
Subjective:  Patient ID: Jeanne Jacobson, female    DOB: January 22, 1971  Age: 52 y.o. MRN: [id number]  Chief Complaint  Patient presents with   Hyperlipidemia    HPI  Mixed hyperlipidemia  Pt presents with hyperlipidemia. . Compliance with treatment has been good -The patient is compliant with medications, maintains a low cholesterol diet , follows up as directed , and maintains an exercise regimen . The patient denies experiencing any hypercholesterolemia related symptoms. Currently taking crestor 10mg  every day and fish oil  Pt with history of SVT - follows with cardiology   She states her symptoms are currently controlled on lopressor 50mg  qd Denies chest pain or shortness of breath  Pt with history of chronic low back pain with intermittent sciatica - she uses neurontin and meloxicam which are helpful for symptoms - no current symptoms today  Pt with history of anxiety - she is currently on citalopram 10mg  every day and thinks she could benefit with increased dose of medication  Pt is in boot left leg - had surgery by Dr Marylene Land a few weeks ago for fracture of heel, fibula and torn tendons Current Outpatient Medications on File Prior to Visit  Medication Sig Dispense Refill   estradiol (ESTRACE) 2 MG tablet Take 1 tablet (2 mg total) by mouth daily. 90 tablet 1   fluticasone (FLONASE) 50 MCG/ACT nasal spray Place 2 sprays into both nostrils daily. 16 g 6   gabapentin (NEURONTIN) 100 MG capsule TAKE 1 CAPSULE BY MOUTH TWICE DAILY 180 capsule 0   metoprolol tartrate (LOPRESSOR) 50 MG tablet TAKE 1 TABLET BY MOUTH IN THE MORNING, AT NOON AND AT BEDTIME. 180 tablet 3   Omega-3 Fatty Acids (FISH OIL) 1000 MG CAPS Take by mouth.     rosuvastatin (CRESTOR) 10 MG tablet TAKE 1 TABLET BY MOUTH EVERY DAY 90 tablet 0   No current facility-administered medications on file prior to visit.   Past Medical History:  Diagnosis Date   Anxiety    Hyperlipidemia    Past Surgical History:   Procedure Laterality Date   ABDOMINAL HYSTERECTOMY     OOPHORECTOMY Right    TONSILLECTOMY     TUBAL LIGATION      Family History  Problem Relation Age of Onset   Coronary artery disease Father    Social History   Socioeconomic History   Marital status: Married    Spouse name: Not on file   Number of children: 2   Years of education: Not on file   Highest education level: Not on file  Occupational History   Occupation: Sports administrator  Tobacco Use   Smoking status: Never   Smokeless tobacco: Never  Vaping Use   Vaping status: Never Used  Substance and Sexual Activity   Alcohol use: Yes    Comment: social   Drug use: Never   Sexual activity: Not on file  Other Topics Concern   Not on file  Social History Narrative   Not on file   Social Determinants of Health   Financial Resource Strain: Low Risk  (09/26/2022)   Overall Financial Resource Strain (CARDIA)    Difficulty of Paying Living Expenses: Not hard at all  Food Insecurity: No Food Insecurity (09/26/2022)   Hunger Vital Sign    Worried About Running Out of Food in the Last Year: Never true    Ran Out of Food in the Last Year: Never true  Transportation Needs: No Transportation Needs (09/26/2022)   PRAPARE - Transportation  Lack of Transportation (Medical): No    Lack of Transportation (Non-Medical): No  Physical Activity: Inactive (09/26/2022)   Exercise Vital Sign    Days of Exercise per Week: 0 days    Minutes of Exercise per Session: 0 min  Stress: No Stress Concern Present (09/26/2022)   [employer] of Occupational Health - Occupational Stress Questionnaire    Feeling of Stress : Not at all  Social Connections: Moderately Integrated (09/26/2022)   Social Connection and Isolation Panel [NHANES]    Frequency of Communication with Friends and Family: More than three times a week    Frequency of Social Gatherings with Friends and Family: More than three times a week    Attends Religious Services: More than  4 times per year    Active Member of [employer] or Organizations: No    Attends Banker Meetings: Never    Marital Status: Married   CONSTITUTIONAL: Negative for chills, fatigue, fever, unintentional weight gain and unintentional weight loss.  E/N/T: Negative for ear pain, nasal congestion and sore throat.  CARDIOVASCULAR: Negative for chest pain, dizziness, palpitations and pedal edema.  RESPIRATORY: Negative for recent cough and dyspnea.  GASTROINTESTINAL: Negative for abdominal pain, acid reflux symptoms, constipation, diarrhea, nausea and vomiting.  MSK: see HPI INTEGUMENTARY: Negative for rash.  NEUROLOGICAL: Negative for dizziness and headaches.  PSYCHIATRIC: see HPI      Objective:  PHYSICAL EXAM:   VS: BP 106/70 (BP Location: Left Arm, Patient Position: Sitting, Cuff Size: Large)   Pulse (!) 56   Temp 97.9 F (36.6 C) (Temporal)   Ht 5\' 7"  (1.702 m)   Wt 185 lb (83.9 kg)   SpO2 97%   BMI 28.98 kg/m   GEN: Well nourished, well developed, in no acute distress  Cardiac: RRR; no murmurs, rubs, or gallops,no edema -  Respiratory:  normal respiratory rate and pattern with no distress - normal breath sounds with no rales, rhonchi, wheezes or rubs MS: left leg in boot Skin: warm and dry, no rash  Neuro:  Alert and Oriented x 3, - CN II-Xii grossly intact Psych: euthymic mood, appropriate affect and demeanor   Lab Results  Component Value Date   WBC 5.7 03/25/2022   HGB 12.6 03/25/2022   HCT 37.4 03/25/2022   PLT 211 03/25/2022   GLUCOSE 88 03/25/2022   CHOL 181 03/25/2022   TRIG 230 (H) 03/25/2022   HDL 64 03/25/2022   LDLCALC 79 03/25/2022   ALT 12 03/25/2022   AST 12 03/25/2022   NA 137 03/25/2022   K 4.8 03/25/2022   CL 101 03/25/2022   CREATININE 0.91 03/25/2022   BUN 19 03/25/2022   CO2 23 03/25/2022   TSH 4.050 03/25/2022      Assessment & Plan:    Mixed hyperlipidemia Labwork pending Continue meds Watch diet Supraventricular  tachycardia Continue med Follow up with cardiology as directed Anxiety Increase citalopram to 20mg  qd Chronic right-sided low back pain with right-sided sciatica Continue meds prn   Meds ordered this encounter  Medications   citalopram (CELEXA) 20 MG tablet    Sig: Take 1 tablet (20 mg total) by mouth daily.    Dispense:  90 tablet    Refill:  1    Order Specific Question:   Supervising Provider    Answer:   Corey Harold   meloxicam (MOBIC) 7.5 MG tablet    Sig: Take 1 tablet (7.5 mg total) by mouth daily.    Dispense:  90  tablet    Refill:  1    Order Specific Question:   Supervising Provider    AnswerCorey Harold    Orders Placed This Encounter  Procedures   CBC with Differential/Platelet   Comprehensive metabolic panel   TSH   Lipid panel   VITAMIN D 25 Hydroxy (Vit-D Deficiency, Fractures)     Follow-up: Return in about 6 months (around 03/29/2023) for fasting physical - 20 min.  An After Visit Summary was printed and given to the patient.  Jettie Pagan [facility] [phone]

## 2022-09-27 LAB — CBC WITH DIFFERENTIAL/PLATELET
Basophils Absolute: 0 10*3/uL (ref 0.0–0.2)
Basos: 0 %
EOS (ABSOLUTE): 0.1 10*3/uL (ref 0.0–0.4)
Eos: 3 %
Hematocrit: 35.5 % (ref 34.0–46.6)
Hemoglobin: 11.7 g/dL (ref 11.1–15.9)
Immature Grans (Abs): 0 10*3/uL (ref 0.0–0.1)
Immature Granulocytes: 0 %
Lymphocytes Absolute: 2.3 10*3/uL (ref 0.7–3.1)
Lymphs: 48 %
MCH: 30.8 pg (ref 26.6–33.0)
MCHC: 33 g/dL (ref 31.5–35.7)
MCV: 93 fL (ref 79–97)
Monocytes Absolute: 0.4 10*3/uL (ref 0.1–0.9)
Monocytes: 9 %
Neutrophils Absolute: 1.9 10*3/uL (ref 1.4–7.0)
Neutrophils: 40 %
Platelets: 184 10*3/uL (ref 150–450)
RBC: 3.8 x10E6/uL (ref 3.77–5.28)
RDW: 13.1 % (ref 11.7–15.4)
WBC: 4.7 10*3/uL (ref 3.4–10.8)

## 2022-09-27 LAB — COMPREHENSIVE METABOLIC PANEL
ALT: 12 IU/L (ref 0–32)
AST: 12 IU/L (ref 0–40)
Albumin: 4.4 g/dL (ref 3.8–4.9)
Alkaline Phosphatase: 62 IU/L (ref 44–121)
BUN/Creatinine Ratio: 20 (ref 9–23)
BUN: 18 mg/dL (ref 6–24)
Bilirubin Total: 0.2 mg/dL (ref 0.0–1.2)
CO2: 25 mmol/L (ref 20–29)
Calcium: 9.6 mg/dL (ref 8.7–10.2)
Chloride: 101 mmol/L (ref 96–106)
Creatinine, Ser: 0.88 mg/dL (ref 0.57–1.00)
Globulin, Total: 2.5 g/dL (ref 1.5–4.5)
Glucose: 91 mg/dL (ref 70–99)
Potassium: 4.9 mmol/L (ref 3.5–5.2)
Sodium: 139 mmol/L (ref 134–144)
Total Protein: 6.9 g/dL (ref 6.0–8.5)
eGFR: 79 mL/min/{1.73_m2} (ref >59)

## 2022-09-27 LAB — LIPID PANEL
Chol/HDL Ratio: 3 ratio (ref 0.0–4.4)
Cholesterol, Total: 190 mg/dL (ref 100–199)
HDL: 64 mg/dL (ref >39)
LDL Chol Calc (NIH): 86 mg/dL (ref 0–99)
Triglycerides: 241 mg/dL — ABNORMAL HIGH (ref 0–149)
VLDL Cholesterol Cal: 40 mg/dL (ref 5–40)

## 2022-09-27 LAB — TSH: TSH: 3.26 u[IU]/mL (ref 0.450–4.500)

## 2022-09-27 LAB — VITAMIN D 25 HYDROXY (VIT D DEFICIENCY, FRACTURES): Vit D, 25-Hydroxy: 60.7 ng/mL (ref 30.0–100.0)

## 2022-10-24 ENCOUNTER — Encounter: Payer: Self-pay | Admitting: Cardiology

## 2022-10-24 ENCOUNTER — Ambulatory Visit: Payer: BC Managed Care – PPO | Attending: Cardiology

## 2022-10-24 ENCOUNTER — Ambulatory Visit: Payer: BC Managed Care – PPO | Attending: Cardiology | Admitting: Cardiology

## 2022-10-24 VITALS — BP 108/74 | HR 58 | Ht 67.0 in | Wt 187.8 lb

## 2022-10-24 DIAGNOSIS — I471 Supraventricular tachycardia, unspecified: Secondary | ICD-10-CM | POA: Diagnosis not present

## 2022-10-24 DIAGNOSIS — F419 Anxiety disorder, unspecified: Secondary | ICD-10-CM | POA: Diagnosis not present

## 2022-10-24 DIAGNOSIS — E785 Hyperlipidemia, unspecified: Secondary | ICD-10-CM | POA: Diagnosis not present

## 2022-10-24 DIAGNOSIS — I2510 Atherosclerotic heart disease of native coronary artery without angina pectoris: Secondary | ICD-10-CM | POA: Diagnosis not present

## 2022-10-24 DIAGNOSIS — I2584 Coronary atherosclerosis due to calcified coronary lesion: Secondary | ICD-10-CM

## 2022-10-24 NOTE — Progress Notes (Signed)
Cardiology Office Note:    Date:  10/24/2022   ID:  Jeanne Jacobson, DOB 09-27-1970, MRN [id number]  PCP:  Marianne Sofia, PA-C  Cardiologist:  Gypsy Balsam, MD    Referring MD: Marianne Sofia, PA-C   Chief Complaint  Patient presents with   Follow-up    History of Present Illness:    Jeanne Jacobson is a 52 y.o. female past medical history significant for supraventricular tachycardia successfully suppressed with metoprolol tartrate 75 mg twice daily, elevated calcium score 20 with dyslipidemia managed with cholesterol lowering medication statin comes today to months for follow-up she said overall she is doing well majority of time but she does describe to have some palpitations, she feels some skipped beats.  She feels her heart skipping especially evening time when she is laying down in the bed.  Denies have any chest pain tightness squeezing pressure burning chest  Past Medical History:  Diagnosis Date   Anxiety    Hyperlipidemia     Past Surgical History:  Procedure Laterality Date   ABDOMINAL HYSTERECTOMY     OOPHORECTOMY Right    TONSILLECTOMY     TUBAL LIGATION      Current Medications: Current Meds  Medication Sig   citalopram (CELEXA) 20 MG tablet Take 1 tablet (20 mg total) by mouth daily.   estradiol (ESTRACE) 2 MG tablet TAKE 1 TABLET BY MOUTH EVERY DAY   fluticasone (FLONASE) 50 MCG/ACT nasal spray Place 2 sprays into both nostrils daily.   gabapentin (NEURONTIN) 100 MG capsule TAKE 1 CAPSULE BY MOUTH TWICE DAILY   meloxicam (MOBIC) 7.5 MG tablet Take 1 tablet (7.5 mg total) by mouth daily.   metoprolol tartrate (LOPRESSOR) 50 MG tablet TAKE 1 TABLET BY MOUTH IN THE MORNING, AT NOON AND AT BEDTIME. (Patient taking differently: Take 50 mg by mouth 2 (two) times daily.)   Omega-3 Fatty Acids (FISH OIL) 1000 MG CAPS Take 1 capsule by mouth daily.   rosuvastatin (CRESTOR) 10 MG tablet TAKE 1 TABLET BY MOUTH EVERY DAY     Allergies:   Penicillins   Social  History   Socioeconomic History   Marital status: Married    Spouse name: Not on file   Number of children: 2   Years of education: Not on file   Highest education level: Not on file  Occupational History   Occupation: [employer]  Tobacco Use   Smoking status: Never   Smokeless tobacco: Never  Vaping Use   Vaping status: Never Used  Substance and Sexual Activity   Alcohol use: Yes    Comment: social   Drug use: Never   Sexual activity: Not on file  Other Topics Concern   Not on file  Social History Narrative   Not on file   Social Determinants of Health   Financial Resource Strain: Low Risk  (09/26/2022)   Overall Financial Resource Strain (CARDIA)    Difficulty of Paying Living Expenses: Not hard at all  Food Insecurity: No Food Insecurity (09/26/2022)   Hunger Vital Sign    Worried About Running Out of Food in the Last Year: Never true    Ran Out of Food in the Last Year: Never true  Transportation Needs: No Transportation Needs (09/26/2022)   PRAPARE - Administrator, Civil Service (Medical): No    Lack of Transportation (Non-Medical): No  Physical Activity: Inactive (09/26/2022)   Exercise Vital Sign    Days of Exercise per Week: 0 days    Minutes of  Exercise per Session: 0 min  Stress: No Stress Concern Present (09/26/2022)   [employer] of Occupational Health - Occupational Stress Questionnaire    Feeling of Stress : Not at all  Social Connections: Moderately Integrated (09/26/2022)   Social Connection and Isolation Panel [NHANES]    Frequency of Communication with Friends and Family: More than three times a week    Frequency of Social Gatherings with Friends and Family: More than three times a week    Attends Religious Services: More than 4 times per year    Active Member of [employer] or Organizations: No    Attends Banker Meetings: Never    Marital Status: Married     Family History: The patient's family history includes Coronary  artery disease in her father. ROS:   Please see the history of present illness.    All 14 point review of systems negative except as described per history of present illness  EKGs/Labs/Other Studies Reviewed:    EKG Interpretation Date/Time:  Thursday October 24 2022 16:21:06 EDT Ventricular Rate:  58 PR Interval:  166 QRS Duration:  66 QT Interval:  432 QTC Calculation: 424 R Axis:   39  Text Interpretation: Sinus bradycardia Otherwise normal ECG No previous ECGs available Confirmed by Gypsy Balsam [phone]) on 10/24/2022 4:31:17 PM    Recent Labs: 09/26/2022: ALT 12; BUN 18; Creatinine, Ser 0.88; Hemoglobin 11.7; Platelets 184; Potassium 4.9; Sodium 139; TSH 3.260  Recent Lipid Panel    Component Value Date/Time   CHOL 190 09/26/2022 0846   TRIG 241 (H) 09/26/2022 0846   HDL 64 09/26/2022 0846   CHOLHDL 3.0 09/26/2022 0846   LDLCALC 86 09/26/2022 0846    Physical Exam:    VS:  BP 108/74 (BP Location: Left Arm, Patient Position: Sitting)   Pulse (!) 58   Ht 5\' 7"  (1.702 m)   Wt 187 lb 12.8 oz (85.2 kg)   SpO2 95%   BMI 29.41 kg/m     Wt Readings from Last 3 Encounters:  10/24/22 187 lb 12.8 oz (85.2 kg)  09/26/22 185 lb (83.9 kg)  04/22/22 181 lb 9.6 oz (82.4 kg)     GEN:  Well nourished, well developed in no acute distress HEENT: Normal NECK: No JVD; No carotid bruits LYMPHATICS: No lymphadenopathy CARDIAC: RRR, no murmurs, no rubs, no gallops RESPIRATORY:  Clear to auscultation without rales, wheezing or rhonchi  ABDOMEN: Soft, non-tender, non-distended MUSCULOSKELETAL:  No edema; No deformity  SKIN: Warm and dry LOWER EXTREMITIES: no swelling NEUROLOGIC:  Alert and oriented x 3 PSYCHIATRIC:  Normal affect   ASSESSMENT:    1. Supraventricular tachycardia   2. Coronary artery calcification calcium score 20 in 2023   3. Dyslipidemia   4. Anxiety    PLAN:    In order of problems listed above:  Supraventricular tachycardia.  Look like she does not  have any sustained arrhythmia but does have bothersome palpitations, therefore, I will schedule him to have Zio patch for 2 weeks to make sure she did not have any important arrhythmia. Elevation of calcium score but she is asymptomatic.  Will continue present management. Dyslipidemia she is on Crestor 10 LDL 86 HDL 64 this is from 09/26/2022 we will continue that management. Anxiety noted stable   Medication Adjustments/Labs and Tests Ordered: Current medicines are reviewed at length with the patient today.  Concerns regarding medicines are outlined above.  Orders Placed This Encounter  Procedures   EKG 12-Lead   Medication changes:  No orders of the defined types were placed in this encounter.   Signed, Georgeanna Lea, MD, [facility] 10/24/2022 4:42 PM    [facility]

## 2022-10-24 NOTE — Patient Instructions (Addendum)
Medication Instructions:  Your physician recommends that you continue on your current medications as directed. Please refer to the Current Medication list given to you today.  *If you need a refill on your cardiac medications before your next appointment, please call your pharmacy*   Lab Work: None Ordered If you have labs (blood work) drawn today and your tests are completely normal, you will receive your results only by: [place] (if you have MyChart) OR A paper copy in the mail If you have any lab test that is abnormal or we need to change your treatment, we will call you to review the results.   Testing/Procedures:  WHY IS MY DOCTOR PRESCRIBING ZIO? The Zio system is proven and trusted by physicians to detect and diagnose irregular heart rhythms -- and has been prescribed to hundreds of thousands of patients.  The FDA has cleared the Zio system to monitor for many different kinds of irregular heart rhythms. In a study, physicians were able to reach a diagnosis 90% of the time with the Zio system1.  You can wear the Zio monitor -- a small, discreet, comfortable patch -- during your normal day-to-day activity, including while you sleep, shower, and exercise, while it records every single heartbeat for analysis.  1Barrett, P., et al. Comparison of 24 Hour Holter Monitoring Versus 14 Day Novel Adhesive Patch Electrocardiographic Monitoring. [place], 2014.  ZIO VS. HOLTER MONITORING The Zio monitor can be comfortably worn for up to 14 days. Holter monitors can be worn for 24 to 48 hours, limiting the time to record any irregular heart rhythms you may have. Zio is able to capture data for the 51% of patients who have their first symptom-triggered arrhythmia after 48 hours.1  LIVE WITHOUT RESTRICTIONS The Zio ambulatory cardiac monitor is a small, unobtrusive, and water-resistant patch--you might even forget you're wearing it. The Zio monitor records and stores  every beat of your heart, whether you're sleeping, working out, or showering.     Follow-Up: At [facility], you and your health needs are our priority.  As part of our continuing mission to provide you with exceptional heart care, we have created designated Provider Care Teams.  These Care Teams include your primary Cardiologist (physician) and Advanced Practice Providers (APPs -  Physician Assistants and Nurse Practitioners) who all work together to provide you with the care you need, when you need it.  We recommend signing up for the patient portal called "MyChart".  Sign up information is provided on this After Visit Summary.  MyChart is used to connect with patients for Virtual Visits (Telemedicine).  Patients are able to view lab/test results, encounter notes, upcoming appointments, etc.  Non-urgent messages can be sent to your provider as well.   To learn more about what you can do with MyChart, go to [email or web address].    Your next appointment:   12 month(s)  The format for your next appointment:   In Person  Provider:   Jenne Campus, MD    Other Instructions NA

## 2022-12-06 ENCOUNTER — Telehealth: Payer: Self-pay

## 2022-12-06 NOTE — Telephone Encounter (Signed)
Spoke with pt regarding monitor results. She stated that her medication helps her symptoms and she will let Dr. Bing Matter know if the symptoms become more bothersome.

## 2022-12-06 NOTE — Telephone Encounter (Signed)
LVM for pt to call regarding monitor results

## 2022-12-23 ENCOUNTER — Telehealth: Payer: Self-pay

## 2022-12-23 NOTE — Telephone Encounter (Signed)
Pt called wanting to get Metoprolol increased of changed due to symptomatic SVT. Dr. Bing Matter recommended that she come for an EKG before adding any medications. Pt agreed. Will send to front desk for scheduling.

## 2023-01-11 ENCOUNTER — Other Ambulatory Visit: Payer: Self-pay | Admitting: Physician Assistant

## 2023-01-20 ENCOUNTER — Other Ambulatory Visit: Payer: Self-pay

## 2023-01-20 MED ORDER — ESTRADIOL 2 MG PO TABS: 2.0000 mg | ORAL_TABLET | Freq: Every day | ORAL | 0 refills | Status: DC

## 2023-02-26 ENCOUNTER — Other Ambulatory Visit: Payer: Self-pay | Admitting: Cardiology

## 2023-04-07 ENCOUNTER — Ambulatory Visit: Payer: 59 | Admitting: Physician Assistant

## 2023-05-12 ENCOUNTER — Ambulatory Visit (INDEPENDENT_AMBULATORY_CARE_PROVIDER_SITE_OTHER): Payer: 59 | Admitting: Physician Assistant

## 2023-05-12 ENCOUNTER — Encounter: Payer: Self-pay | Admitting: Physician Assistant

## 2023-05-12 VITALS — BP 112/70 | HR 64 | Temp 97.8°F | Resp 18 | Ht 67.0 in | Wt 186.0 lb

## 2023-05-12 DIAGNOSIS — Z0000 Encounter for general adult medical examination without abnormal findings: Secondary | ICD-10-CM | POA: Diagnosis not present

## 2023-05-12 LAB — POCT URINALYSIS DIP (CLINITEK)
Blood, UA: NEGATIVE
Glucose, UA: NEGATIVE mg/dL
Leukocytes, UA: NEGATIVE
Nitrite, UA: NEGATIVE
POC PROTEIN,UA: 30 — AB
Spec Grav, UA: >=1.03 — AB (ref 1.010–1.025)
Urobilinogen, UA: 0.2 U/dL
pH, UA: 5 (ref 5.0–8.0)

## 2023-05-12 NOTE — Progress Notes (Signed)
 Subjective:  Patient ID: Jeanne Jacobson, female    DOB: September 23, 1970  Age: 53 y.o. MRN: [id number]  Chief Complaint  Patient presents with   Annual Exam    HPI Well Adult Physical: Patient here for a comprehensive physical exam.The patient reports no problems Do you take any herbs or supplements that were not prescribed by a doctor? no Are you taking calcium supplements? no Are you taking aspirin daily? no  Encounter for general adult medical examination without abnormal findings  Physical ("At Risk" items are starred): Patient's last physical exam was 1 year ago .  Patient is not afflicted from Stress Incontinence and Urge Incontinence  Patient wears a seat belts Patient has smoke detectors and has carbon monoxide detectors. Patient practices appropriate gun safety. Patient wears sunscreen with extended sun exposure. Dental Care: brushes and flosses daily. Last dental visit: is overdue Vision impairments: wears glasses Ophthalmology/Optometry: is due Hearing loss: none  No LMP recorded. Patient has had a hysterectomy.  Safe at home: Yes Self breast exams: Yes Last pap: hysterectomy Last mammogram: 09/2022     05/12/2023    1:44 PM 09/26/2022    8:11 AM 03/25/2022    3:04 PM 06/28/2021    3:40 PM 08/08/2020    2:36 PM  Depression screen PHQ 2/9  Decreased Interest 0 0 0 0 0  Down, Depressed, Hopeless 0 0 0 0 0  PHQ - 2 Score 0 0 0 0 0  Altered sleeping 0  0    Tired, decreased energy 0  1    Change in appetite 0  0    Feeling bad or failure about yourself  0  0    Trouble concentrating 0  0    Moving slowly or fidgety/restless 0  0    Suicidal thoughts 0  0    PHQ-9 Score 0  1    Difficult doing work/chores Not difficult at all  Not difficult at all           06/09/2020    8:18 AM 06/28/2021    3:40 PM 03/25/2022    3:05 PM 09/26/2022    8:11 AM 05/12/2023    1:44 PM  Fall Risk  Falls in the past year? 0 0 0 1 1  Was there an injury with Fall? 0 0 0 1 1  Fall Risk  Category Calculator 0 0 0 2 2  Fall Risk Category (Retired) Low Low     (RETIRED) Patient Fall Risk Level Low fall risk Low fall risk     Patient at Risk for Falls Due to No Fall Risks  Mental status change History of fall(s);No Fall Risks No Fall Risks  Fall risk Follow up   Falls evaluation completed Falls evaluation completed Falls evaluation completed             Social Hx   Social History   Socioeconomic History   Marital status: Married    Spouse name: Not on file   Number of children: 2   Years of education: Not on file   Highest education level: Not on file  Occupational History   Occupation: [employer]  Tobacco Use   Smoking status: Never   Smokeless tobacco: Never  Vaping Use   Vaping status: Never Used  Substance and Sexual Activity   Alcohol use: Yes    Comment: social   Drug use: Never   Sexual activity: Not on file  Other Topics Concern   Not on file  Social History Narrative   Not on file   Social Drivers of Health   Financial Resource Strain: Low Risk  (09/26/2022)   Overall Financial Resource Strain (CARDIA)    Difficulty of Paying Living Expenses: Not hard at all  Food Insecurity: No Food Insecurity (09/26/2022)   Hunger Vital Sign    Worried About Running Out of Food in the Last Year: Never true    Ran Out of Food in the Last Year: Never true  Transportation Needs: No Transportation Needs (09/26/2022)   PRAPARE - Administrator, Civil Service (Medical): No    Lack of Transportation (Non-Medical): No  Physical Activity: Inactive (09/26/2022)   Exercise Vital Sign    Days of Exercise per Week: 0 days    Minutes of Exercise per Session: 0 min  Stress: No Stress Concern Present (09/26/2022)   [employer] of Occupational Health - Occupational Stress Questionnaire    Feeling of Stress : Not at all  Social Connections: Moderately Integrated (09/26/2022)   Social Connection and Isolation Panel [NHANES]    Frequency of Communication with  Friends and Family: More than three times a week    Frequency of Social Gatherings with Friends and Family: More than three times a week    Attends Religious Services: More than 4 times per year    Active Member of [employer] or Organizations: No    Attends Engineer, structural: Never    Marital Status: Married   Past Medical History:  Diagnosis Date   Anxiety    Hyperlipidemia    Past Surgical History:  Procedure Laterality Date   ABDOMINAL HYSTERECTOMY     OOPHORECTOMY Right    TONSILLECTOMY     TUBAL LIGATION      Family History  Problem Relation Age of Onset   Coronary artery disease Father     ROS CONSTITUTIONAL: Negative for chills, fatigue, fever, unintentional weight gain and unintentional weight loss.  E/N/T: Negative for ear pain, nasal congestion and sore throat.  CARDIOVASCULAR: Negative for chest pain, dizziness, palpitations and pedal edema.  RESPIRATORY: Negative for recent cough and dyspnea.  GASTROINTESTINAL: Negative for abdominal pain, acid reflux symptoms, constipation, diarrhea, nausea and vomiting.  MSK: Negative for arthralgias and myalgias.  INTEGUMENTARY: Negative for rash.  NEUROLOGICAL: Negative for dizziness and headaches.  PSYCHIATRIC: Negative for sleep disturbance and to question depression screen.  Negative for depression, negative for anhedonia.   Objective:  PHYSICAL EXAM:   BP 112/70 (BP Location: Left Arm, Patient Position: Sitting, Cuff Size: Normal)   Pulse 64   Temp 97.8 F (36.6 C) (Temporal)   Resp 18   Ht 5\' 7"  (1.702 m)   Wt 186 lb (84.4 kg)   SpO2 96%   BMI 29.13 kg/m   Vision Screening   Right eye Left eye Both eyes  Without correction     With correction 20/20 20/20 20/20     GEN: Well nourished, well developed, in no acute distress  HEENT: normal external ears and nose - normal external auditory canals and TMS - hearing grossly normal - normal nasal mucosa and septum - Lips, Teeth and Gums - normal  Oropharynx  - normal mucosa, palate, and posterior pharynx Neck: no JVD or masses - no thyromegaly Cardiac: RRR; no murmurs, rubs, or gallops,no edema - no significant varicosities Respiratory:  normal respiratory rate and pattern with no distress - normal breath sounds with no rales, rhonchi, wheezes or rubs GI: normal bowel sounds, no masses or  tenderness MS: no deformity or atrophy  Skin: warm and dry, no rash  Neuro:  Alert and Oriented x 3, Strength and sensation are intact - CN II-Xii grossly intact Psych: euthymic mood, appropriate affect and demeanor  Office Visit on 05/12/2023  Component Date Value Ref Range Status   Color, UA 05/12/2023 straw (A)  yellow Final   Clarity, UA 05/12/2023 cloudy (A)  clear Final   Glucose, UA 05/12/2023 negative  negative mg/dL Final   Bilirubin, UA 91/47/8295 small (A)  negative Final   Ketones, POC UA 05/12/2023 trace (5) (A)  negative mg/dL Final   Spec Grav, UA 62/13/0865 >=1.030 (A)  1.010 - 1.025 Final   Blood, UA 05/12/2023 negative  negative Final   pH, UA 05/12/2023 5.0  5.0 - 8.0 Final   POC PROTEIN,UA 05/12/2023 =30 (A)  negative, trace Final   Urobilinogen, UA 05/12/2023 0.2  0.2 or 1.0 E.U./dL Final   Nitrite, UA 78/46/9629 Negative  Negative Final   Leukocytes, UA 05/12/2023 Negative  Negative Final    Assessment & Plan:  Annual physical exam -     POCT URINALYSIS DIP (CLINITEK) -     CBC with Differential/Platelet; Future -     Comprehensive metabolic panel; Future -     TSH; Future -     Lipid panel; Future    This is a list of the screening recommended for you and due dates:  Health Maintenance  Topic Date Due   Flu Shot  06/09/2023*   Zoster (Shingles) Vaccine (1 of 2) 08/12/2023*   Colon Cancer Screening  09/26/2023*   Pneumococcal Vaccination (1 of 2 - PCV) 05/11/2024*   Mammogram  09/24/2023   DTaP/Tdap/Td vaccine (3 - Td or Tdap) 11/01/2026   HPV Vaccine  Aged Out   COVID-19 Vaccine  Discontinued   Hepatitis C Screening   Discontinued   HIV Screening  Discontinued  *Topic was postponed. The date shown is not the original due date.     Follow-up: Return in about 6 months (around 11/12/2023) for chronic fasting follow-up.  An After Visit Summary was printed and given to the patient.  Jettie Pagan [facility] [phone]

## 2023-05-23 ENCOUNTER — Other Ambulatory Visit

## 2023-05-23 ENCOUNTER — Other Ambulatory Visit: Payer: Self-pay | Admitting: Physician Assistant

## 2023-05-23 DIAGNOSIS — Z0000 Encounter for general adult medical examination without abnormal findings: Secondary | ICD-10-CM

## 2023-05-23 DIAGNOSIS — F419 Anxiety disorder, unspecified: Secondary | ICD-10-CM

## 2023-05-24 LAB — LIPID PANEL
Chol/HDL Ratio: 2.8 ratio (ref 0.0–4.4)
Cholesterol, Total: 179 mg/dL (ref 100–199)
HDL: 64 mg/dL (ref >39)
LDL Chol Calc (NIH): 86 mg/dL (ref 0–99)
Triglycerides: 175 mg/dL — ABNORMAL HIGH (ref 0–149)
VLDL Cholesterol Cal: 29 mg/dL (ref 5–40)

## 2023-05-24 LAB — COMPREHENSIVE METABOLIC PANEL
ALT: 10 IU/L (ref 0–32)
AST: 14 IU/L (ref 0–40)
Albumin: 4.1 g/dL (ref 3.8–4.9)
Alkaline Phosphatase: 60 IU/L (ref 44–121)
BUN/Creatinine Ratio: 19 (ref 9–23)
BUN: 16 mg/dL (ref 6–24)
Bilirubin Total: 0.4 mg/dL (ref 0.0–1.2)
CO2: 23 mmol/L (ref 20–29)
Calcium: 9.2 mg/dL (ref 8.7–10.2)
Chloride: 103 mmol/L (ref 96–106)
Creatinine, Ser: 0.85 mg/dL (ref 0.57–1.00)
Globulin, Total: 2.3 g/dL (ref 1.5–4.5)
Glucose: 89 mg/dL (ref 70–99)
Potassium: 5.3 mmol/L — ABNORMAL HIGH (ref 3.5–5.2)
Sodium: 139 mmol/L (ref 134–144)
Total Protein: 6.4 g/dL (ref 6.0–8.5)
eGFR: 82 mL/min/{1.73_m2} (ref >59)

## 2023-05-24 LAB — CBC WITH DIFFERENTIAL/PLATELET
Basophils Absolute: 0 10*3/uL (ref 0.0–0.2)
Basos: 0 %
EOS (ABSOLUTE): 0.1 10*3/uL (ref 0.0–0.4)
Eos: 1 %
Hematocrit: 35.7 % (ref 34.0–46.6)
Hemoglobin: 11.8 g/dL (ref 11.1–15.9)
Immature Grans (Abs): 0 10*3/uL (ref 0.0–0.1)
Immature Granulocytes: 0 %
Lymphocytes Absolute: 2.1 10*3/uL (ref 0.7–3.1)
Lymphs: 39 %
MCH: 31.3 pg (ref 26.6–33.0)
MCHC: 33.1 g/dL (ref 31.5–35.7)
MCV: 95 fL (ref 79–97)
Monocytes Absolute: 0.6 10*3/uL (ref 0.1–0.9)
Monocytes: 10 %
Neutrophils Absolute: 2.7 10*3/uL (ref 1.4–7.0)
Neutrophils: 50 %
Platelets: 184 10*3/uL (ref 150–450)
RBC: 3.77 x10E6/uL (ref 3.77–5.28)
RDW: 13.1 % (ref 11.7–15.4)
WBC: 5.5 10*3/uL (ref 3.4–10.8)

## 2023-05-24 LAB — TSH: TSH: 3 u[IU]/mL (ref 0.450–4.500)

## 2023-05-27 NOTE — Telephone Encounter (Signed)
 Copied from CRM [id number]. Topic: Clinical - Lab/Test Results >> May 27, 2023  2:45 PM Ivette P wrote: Reason for CRM: PT called in to get lab results.   Read as follows :  "CBC normal TSH normal Cmp stable Lipid panel stable - trigs have improved - continue to watch diet and continue meds"   Pt understood and had no further questions.   Pt notified

## 2023-06-10 ENCOUNTER — Ambulatory Visit: Admitting: Physician Assistant

## 2023-06-10 ENCOUNTER — Encounter: Payer: Self-pay | Admitting: Physician Assistant

## 2023-06-10 VITALS — BP 122/80 | HR 68 | Temp 97.8°F | Resp 18 | Ht 67.0 in | Wt 186.4 lb

## 2023-06-10 DIAGNOSIS — J309 Allergic rhinitis, unspecified: Secondary | ICD-10-CM | POA: Insufficient documentation

## 2023-06-10 DIAGNOSIS — J3089 Other allergic rhinitis: Secondary | ICD-10-CM | POA: Diagnosis not present

## 2023-06-10 DIAGNOSIS — R058 Other specified cough: Secondary | ICD-10-CM | POA: Insufficient documentation

## 2023-06-10 LAB — POC COVID19 BINAXNOW: SARS Coronavirus 2 Ag: NEGATIVE

## 2023-06-10 LAB — POCT FLU A/B STATUS
Influenza A, POC: NEGATIVE
Influenza B, POC: NEGATIVE

## 2023-06-10 MED ORDER — MONTELUKAST SODIUM 10 MG PO TABS: 10.0000 mg | ORAL_TABLET | Freq: Every day | ORAL | 3 refills | Status: DC

## 2023-06-10 MED ORDER — TRIAMCINOLONE ACETONIDE 40 MG/ML IJ SUSP
60.0000 mg | Freq: Once | INTRAMUSCULAR | Status: AC
  Administered 2023-06-10: 60 mg via INTRAMUSCULAR

## 2023-06-10 NOTE — Progress Notes (Signed)
 Acute Office Visit  Subjective:    Patient ID: Jeanne Jacobson, female    DOB: 09-Dec-1970, 53 y.o.   MRN: [id number]  Chief Complaint  Patient presents with   Cough   Nasal Congestion    HPI: Patient is in today for complaints of problems with allergies.  She states since the weekend she has had mild laryngitis, sinus congestion and cough (clear sputum) - denies fever or malaise Is taking claritin and flonase for allergies regularly   Current Outpatient Medications:    citalopram (CELEXA) 20 MG tablet, TAKE 1 TABLET(20 MG) BY MOUTH DAILY, Disp: 90 tablet, Rfl: 1   estradiol (ESTRACE) 2 MG tablet, Take 1 tablet (2 mg total) by mouth daily., Disp: 90 tablet, Rfl: 0   fluticasone (FLONASE) 50 MCG/ACT nasal spray, Place 2 sprays into both nostrils daily., Disp: 16 g, Rfl: 6   gabapentin (NEURONTIN) 100 MG capsule, TAKE 1 CAPSULE BY MOUTH TWICE DAILY, Disp: 180 capsule, Rfl: 0   meloxicam (MOBIC) 7.5 MG tablet, Take 1 tablet (7.5 mg total) by mouth daily., Disp: 90 tablet, Rfl: 1   metoprolol tartrate (LOPRESSOR) 50 MG tablet, TAKE 1 TABLET BY MOUTH IN THE MORNING, AT NOON, AND AT BEDTIME, Disp: 270 tablet, Rfl: 2   montelukast (SINGULAIR) 10 MG tablet, Take 1 tablet (10 mg total) by mouth at bedtime., Disp: 30 tablet, Rfl: 3   Omega-3 Fatty Acids (FISH OIL) 1000 MG CAPS, Take 1 capsule by mouth daily., Disp: , Rfl:    rosuvastatin (CRESTOR) 10 MG tablet, TAKE 1 TABLET BY MOUTH EVERY DAY, Disp: 90 tablet, Rfl: 0  Current Facility-Administered Medications:    triamcinolone acetonide (KENALOG-40) injection 60 mg, 60 mg, Intramuscular, Once,   Allergies  Allergen Reactions   Penicillins Hives    ROS CONSTITUTIONAL: Negative for chills, fatigue, fever,  E/N/T: see HPI CARDIOVASCULAR: Negative for chest pain, RESPIRATORY: see HPI      Objective:    PHYSICAL EXAM:   BP 122/80   Pulse 68   Temp 97.8 F (36.6 C) (Temporal)   Resp 18   Ht 5\' 7"  (1.702 m)   Wt 186 lb 6.4 oz  (84.6 kg)   SpO2 99%   BMI 29.19 kg/m    GEN: Well nourished, well developed, in no acute distress  HEENT: normal external ears and nose - normal external auditory canals and TMS - hearing grossly normal - Lips, Teeth and Gums - normal  Oropharynx - normal mucosa, palate, and posterior pharynx  Cardiac: RRR; no murmurs,  Respiratory:  normal respiratory rate and pattern with no distress - normal breath sounds with no rales, rhonchi, wheezes or rubs  Office Visit on 06/10/2023  Component Date Value Ref Range Status   SARS Coronavirus 2 Ag 06/10/2023 Negative  Negative Final   Influenza A, POC 06/10/2023 Negative  Negative Final   Influenza B, POC 06/10/2023 Negative  Negative Final        Assessment & Plan:    Cough productive of clear sputum -     POC COVID-19 BinaxNow -     POCT Flu A & B Status  Seasonal allergic rhinitis due to other allergic trigger -     Triamcinolone Acetonide -     Montelukast Sodium; Take 1 tablet (10 mg total) by mouth at bedtime.  Dispense: 30 tablet; Refill: 3     Follow-up: Return if symptoms worsen or fail to improve.  An After Visit Summary was printed and given to the patient.  Jettie Pagan [facility] [phone]

## 2023-07-14 ENCOUNTER — Other Ambulatory Visit: Payer: Self-pay | Admitting: Physician Assistant

## 2023-07-25 ENCOUNTER — Other Ambulatory Visit: Payer: Self-pay | Admitting: Physician Assistant

## 2023-10-21 ENCOUNTER — Other Ambulatory Visit: Payer: Self-pay | Admitting: Physician Assistant

## 2023-10-21 DIAGNOSIS — J3089 Other allergic rhinitis: Secondary | ICD-10-CM

## 2023-10-24 ENCOUNTER — Ambulatory Visit: Attending: Cardiology | Admitting: Cardiology

## 2023-10-24 ENCOUNTER — Encounter: Payer: Self-pay | Admitting: Cardiology

## 2023-10-24 VITALS — BP 110/80 | HR 66 | Ht 67.0 in | Wt 188.0 lb

## 2023-10-24 DIAGNOSIS — I471 Supraventricular tachycardia, unspecified: Secondary | ICD-10-CM

## 2023-10-24 DIAGNOSIS — E785 Hyperlipidemia, unspecified: Secondary | ICD-10-CM | POA: Diagnosis not present

## 2023-10-24 DIAGNOSIS — E782 Mixed hyperlipidemia: Secondary | ICD-10-CM

## 2023-10-24 DIAGNOSIS — I2510 Atherosclerotic heart disease of native coronary artery without angina pectoris: Secondary | ICD-10-CM

## 2023-10-24 MED ORDER — ROSUVASTATIN CALCIUM 20 MG PO TABS: 20.0000 mg | ORAL_TABLET | Freq: Every day | ORAL | 3 refills | Status: DC

## 2023-10-24 NOTE — Patient Instructions (Signed)
 Medication Instructions:   INCREASE: Crestor  to 20mg  daily   Lab Work: Your physician recommends that you return for lab work in: 6 weeks You need to have labs done when you are fasting.  You can come Monday through Friday 8:30 am to 12:00 pm and 1:15 to 4:30. You do not need to make an appointment as the order has already been placed. The labs you are going to have done are AST, ALT Lipids.    Testing/Procedures: None Ordered   Follow-Up: At [facility], you and your health needs are our priority.  As part of our continuing mission to provide you with exceptional heart care, we have created designated Provider Care Teams.  These Care Teams include your primary Cardiologist (physician) and Advanced Practice Providers (APPs -  Physician Assistants and Nurse Practitioners) who all work together to provide you with the care you need, when you need it.  We recommend signing up for the patient portal called MyChart.  Sign up information is provided on this After Visit Summary.  MyChart is used to connect with patients for Virtual Visits (Telemedicine).  Patients are able to view lab/test results, encounter notes, upcoming appointments, etc.  Non-urgent messages can be sent to your provider as well.   To learn more about what you can do with MyChart, go to [email or web address].    Your next appointment:   12 month  The format for your next appointment:   In Person  Provider:   Lamar Fitch, MD    Other Instructions NA

## 2023-10-28 NOTE — Progress Notes (Signed)
 Cardiology Office Note:    Date:  10/28/2023   ID:  Taiylor Virden, DOB 1970-08-28, MRN [id number]  PCP:  Nicholaus Credit, PA-C  Cardiologist:  Lamar Fitch, MD    Referring MD: Nicholaus Credit, PA-C   Chief Complaint  Patient presents with   Annual Exam    History of Present Illness:    Jeanne Jacobson is a 53 y.o. female past medical history significant for supraventricular tachycardia successfully managed with metoprolol  titrate twice daily 75 mg, elevated calcium  score asymptomatic, dyslipidemia.  Comes today to my office for follow-up.  Overall doing well.  Denies having chest pain tightness squeezing pressure in chest no palpitation dizziness swelling of lower extremities overall doing well  Past Medical History:  Diagnosis Date   Anxiety    Hyperlipidemia     Past Surgical History:  Procedure Laterality Date   ABDOMINAL HYSTERECTOMY     OOPHORECTOMY Right    TONSILLECTOMY     TUBAL LIGATION      Current Medications: Current Meds  Medication Sig   citalopram  (CELEXA ) 20 MG tablet TAKE 1 TABLET(20 MG) BY MOUTH DAILY   estradiol  (ESTRACE ) 2 MG tablet TAKE 1 TABLET BY MOUTH EVERY DAY   meloxicam  (MOBIC ) 15 MG tablet Take 15 mg by mouth daily.   metoprolol  tartrate (LOPRESSOR ) 50 MG tablet TAKE 1 TABLET BY MOUTH IN THE MORNING, AT NOON, AND AT BEDTIME   montelukast  (SINGULAIR ) 10 MG tablet TAKE 1 TABLET(10 MG) BY MOUTH AT BEDTIME   Omega-3 Fatty Acids (FISH OIL) 1000 MG CAPS Take 1 capsule by mouth daily.   rosuvastatin  (CRESTOR ) 20 MG tablet Take 1 tablet (20 mg total) by mouth daily.   [DISCONTINUED] rosuvastatin  (CRESTOR ) 10 MG tablet TAKE 1 TABLET BY MOUTH EVERY DAY     Allergies:   Penicillins   Social History   Socioeconomic History   Marital status: Married    Spouse name: Not on file   Number of children: 2   Years of education: Not on file   Highest education level: Not on file  Occupational History   Occupation: [employer]  Tobacco Use   Smoking  status: Never   Smokeless tobacco: Never  Vaping Use   Vaping status: Never Used  Substance and Sexual Activity   Alcohol use: Yes    Comment: social   Drug use: Never   Sexual activity: Not on file  Other Topics Concern   Not on file  Social History Narrative   Not on file   Social Drivers of Health   Financial Resource Strain: Low Risk  (09/26/2022)   Overall Financial Resource Strain (CARDIA)    Difficulty of Paying Living Expenses: Not hard at all  Food Insecurity: No Food Insecurity (09/26/2022)   Hunger Vital Sign    Worried About Running Out of Food in the Last Year: Never true    Ran Out of Food in the Last Year: Never true  Transportation Needs: No Transportation Needs (09/26/2022)   PRAPARE - Administrator, Civil Service (Medical): No    Lack of Transportation (Non-Medical): No  Physical Activity: Inactive (09/26/2022)   Exercise Vital Sign    Days of Exercise per Week: 0 days    Minutes of Exercise per Session: 0 min  Stress: No Stress Concern Present (09/26/2022)   [employer] of Occupational Health - Occupational Stress Questionnaire    Feeling of Stress : Not at all  Social Connections: Moderately Integrated (09/26/2022)   Social Connection and Isolation  Panel    Frequency of Communication with Friends and Family: More than three times a week    Frequency of Social Gatherings with Friends and Family: More than three times a week    Attends Religious Services: More than 4 times per year    Active Member of [employer] or Organizations: No    Attends Banker Meetings: Never    Marital Status: Married     Family History: The patient's family history includes Coronary artery disease in her father. ROS:   Please see the history of present illness.    All 14 point review of systems negative except as described per history of present illness  EKGs/Labs/Other Studies Reviewed:         Recent Labs: 05/23/2023: ALT 10; BUN 16; Creatinine,  Ser 0.85; Hemoglobin 11.8; Platelets 184; Potassium 5.3; Sodium 139; TSH 3.000  Recent Lipid Panel    Component Value Date/Time   CHOL 179 05/23/2023 0909   TRIG 175 (H) 05/23/2023 0909   HDL 64 05/23/2023 0909   CHOLHDL 2.8 05/23/2023 0909   LDLCALC 86 05/23/2023 0909    Physical Exam:    VS:  BP 110/80   Pulse 66   Ht 5' 7 (1.702 m)   Wt 188 lb (85.3 kg)   SpO2 96%   BMI 29.44 kg/m     Wt Readings from Last 3 Encounters:  10/24/23 188 lb (85.3 kg)  06/10/23 186 lb 6.4 oz (84.6 kg)  05/12/23 186 lb (84.4 kg)     GEN:  Well nourished, well developed in no acute distress HEENT: Normal NECK: No JVD; No carotid bruits LYMPHATICS: No lymphadenopathy CARDIAC: RRR, no murmurs, no rubs, no gallops RESPIRATORY:  Clear to auscultation without rales, wheezing or rhonchi  ABDOMEN: Soft, non-tender, non-distended MUSCULOSKELETAL:  No edema; No deformity  SKIN: Warm and dry LOWER EXTREMITIES: no swelling NEUROLOGIC:  Alert and oriented x 3 PSYCHIATRIC:  Normal affect   ASSESSMENT:    1. Coronary artery calcification   2. Dyslipidemia   3. Supraventricular tachycardia (HCC)   4. Mixed hyperlipidemia    PLAN:    In order of problems listed above:  Calcification, asymptomatic his risk factors modifications. Dyslipidemia will continue with Crestor .  Will recheck her lipid panel as well as HDL 2. Mixed dyslipidemia:   Medication Adjustments/Labs and Tests Ordered: Current medicines are reviewed at length with the patient today.  Concerns regarding medicines are outlined above.  Orders Placed This Encounter  Procedures   ALT   AST   Lipid panel   EKG 12-Lead   Medication changes:  Meds ordered this encounter  Medications   rosuvastatin  (CRESTOR ) 20 MG tablet    Sig: Take 1 tablet (20 mg total) by mouth daily.    Dispense:  90 tablet    Refill:  3    Signed, Lamar DOROTHA Fitch, MD, [facility] 10/28/2023 8:16 AM     [facility]

## 2023-11-19 ENCOUNTER — Ambulatory Visit: Admitting: Physician Assistant

## 2023-12-14 ENCOUNTER — Other Ambulatory Visit: Payer: Self-pay | Admitting: Physician Assistant

## 2023-12-14 DIAGNOSIS — F419 Anxiety disorder, unspecified: Secondary | ICD-10-CM

## 2023-12-15 ENCOUNTER — Other Ambulatory Visit: Payer: Self-pay | Admitting: Physician Assistant

## 2023-12-15 MED ORDER — MELOXICAM 15 MG PO TABS: 15.0000 mg | ORAL_TABLET | Freq: Every day | ORAL | 1 refills | Status: DC

## 2023-12-16 ENCOUNTER — Other Ambulatory Visit: Payer: Self-pay | Admitting: Cardiology

## 2023-12-31 ENCOUNTER — Other Ambulatory Visit: Payer: Self-pay | Admitting: Physician Assistant

## 2023-12-31 ENCOUNTER — Telehealth: Payer: Self-pay

## 2023-12-31 DIAGNOSIS — F419 Anxiety disorder, unspecified: Secondary | ICD-10-CM

## 2023-12-31 DIAGNOSIS — M8588 Other specified disorders of bone density and structure, other site: Secondary | ICD-10-CM

## 2023-12-31 DIAGNOSIS — E782 Mixed hyperlipidemia: Secondary | ICD-10-CM

## 2023-12-31 DIAGNOSIS — I471 Supraventricular tachycardia, unspecified: Secondary | ICD-10-CM

## 2023-12-31 NOTE — Telephone Encounter (Signed)
Will put in order.

## 2023-12-31 NOTE — Telephone Encounter (Signed)
 Copied from CRM [id number]. Topic: Clinical - Request for Lab/Test Order >> Dec 30, 2023  3:42 PM Antwanette L wrote: Reason for CRM: The patient has a follow-up appointment scheduled with Camie Moats on 11/5 for chronic fasting management. The patient is requesting to come in on 11/4 to complete labwork in advance of the visit.There is no lab order on the patient chart. The patient can be contacted at [phone]

## 2024-01-07 ENCOUNTER — Telehealth: Payer: Self-pay | Admitting: Physician Assistant

## 2024-01-07 NOTE — Telephone Encounter (Signed)
 Patient has an appointment on 01/14/24 and wants to know if she can have her labs done the day before. Please let her know.

## 2024-01-09 ENCOUNTER — Other Ambulatory Visit: Payer: Self-pay | Admitting: Physician Assistant

## 2024-01-09 DIAGNOSIS — E782 Mixed hyperlipidemia: Secondary | ICD-10-CM

## 2024-01-09 DIAGNOSIS — F419 Anxiety disorder, unspecified: Secondary | ICD-10-CM

## 2024-01-09 DIAGNOSIS — M8588 Other specified disorders of bone density and structure, other site: Secondary | ICD-10-CM

## 2024-01-09 DIAGNOSIS — I471 Supraventricular tachycardia, unspecified: Secondary | ICD-10-CM

## 2024-01-09 NOTE — Telephone Encounter (Signed)
 Called patient to make her aware, it is okay for her to come in the day before to get labs.

## 2024-01-13 ENCOUNTER — Other Ambulatory Visit

## 2024-01-13 ENCOUNTER — Ambulatory Visit: Admitting: Physician Assistant

## 2024-01-14 ENCOUNTER — Ambulatory Visit: Admitting: Physician Assistant

## 2024-01-14 ENCOUNTER — Encounter: Payer: Self-pay | Admitting: Physician Assistant

## 2024-01-14 VITALS — BP 110/80 | HR 60 | Temp 98.3°F | Resp 18 | Ht 67.0 in | Wt 193.2 lb

## 2024-01-14 DIAGNOSIS — M8588 Other specified disorders of bone density and structure, other site: Secondary | ICD-10-CM

## 2024-01-14 DIAGNOSIS — I471 Supraventricular tachycardia, unspecified: Secondary | ICD-10-CM

## 2024-01-14 DIAGNOSIS — F419 Anxiety disorder, unspecified: Secondary | ICD-10-CM

## 2024-01-14 DIAGNOSIS — E782 Mixed hyperlipidemia: Secondary | ICD-10-CM

## 2024-01-14 DIAGNOSIS — Z1211 Encounter for screening for malignant neoplasm of colon: Secondary | ICD-10-CM

## 2024-01-14 DIAGNOSIS — Z23 Encounter for immunization: Secondary | ICD-10-CM | POA: Diagnosis not present

## 2024-01-14 DIAGNOSIS — Z1231 Encounter for screening mammogram for malignant neoplasm of breast: Secondary | ICD-10-CM

## 2024-01-14 LAB — LIPID PANEL
Chol/HDL Ratio: 2.6 ratio (ref 0.0–4.4)
Cholesterol, Total: 170 mg/dL (ref 100–199)
HDL: 65 mg/dL (ref >39)
LDL Chol Calc (NIH): 82 mg/dL (ref 0–99)
Triglycerides: 136 mg/dL (ref 0–149)
VLDL Cholesterol Cal: 23 mg/dL (ref 5–40)

## 2024-01-14 LAB — ALT: ALT: 11 IU/L (ref 0–32)

## 2024-01-14 LAB — AST: AST: 11 IU/L (ref 0–40)

## 2024-01-14 NOTE — Progress Notes (Deleted)
 Subjective:  Patient ID: Jeanne Jacobson, female    DOB: 1970-11-24  Age: 53 y.o. MRN: [id number]  Chief Complaint  Patient presents with   Medical Management of Chronic Issues    HPI      01/14/2024    4:00 PM 05/12/2023    1:44 PM 09/26/2022    8:11 AM 03/25/2022    3:04 PM 06/28/2021    3:40 PM  Depression screen PHQ 2/9  Decreased Interest 0 0 0 0 0  Down, Depressed, Hopeless 0 0 0 0 0  PHQ - 2 Score 0 0 0 0 0  Altered sleeping  0  0   Tired, decreased energy  0  1   Change in appetite  0  0   Feeling bad or failure about yourself   0  0   Trouble concentrating  0  0   Moving slowly or fidgety/restless  0  0   Suicidal thoughts  0  0   PHQ-9 Score  0  1   Difficult doing work/chores  Not difficult at all  Not difficult at all         06/28/2021    3:40 PM 03/25/2022    3:05 PM 09/26/2022    8:11 AM 05/12/2023    1:44 PM 01/14/2024    3:59 PM  Fall Risk  Falls in the past year? 0 0 1 1 0  Was there an injury with Fall? 0 0 1 1 0  Fall Risk Category Calculator 0 0 2 2 0  Fall Risk Category (Retired) Low       (RETIRED) Patient Fall Risk Level Low fall risk       Patient at Risk for Falls Due to  Mental status change History of fall(s);No Fall Risks No Fall Risks No Fall Risks  Fall risk Follow up  Falls evaluation completed  Falls evaluation completed Falls evaluation completed Falls evaluation completed     Data saved with a previous flowsheet row definition     ROS CONSTITUTIONAL: Negative for chills, fatigue, fever, unintentional weight gain and unintentional weight loss.  E/N/T: Negative for ear pain, nasal congestion and sore throat.  CARDIOVASCULAR: Negative for chest pain, dizziness, palpitations and pedal edema.  RESPIRATORY: Negative for recent cough and dyspnea.  GASTROINTESTINAL: Negative for abdominal pain, acid reflux symptoms, constipation, diarrhea, nausea and vomiting.  MSK: Negative for arthralgias and myalgias.  INTEGUMENTARY: Negative for rash.   NEUROLOGICAL: Negative for dizziness and headaches.  PSYCHIATRIC: Negative for sleep disturbance and to question depression screen.  Negative for depression, negative for anhedonia.    Current Outpatient Medications:    citalopram  (CELEXA ) 20 MG tablet, TAKE 1 TABLET(20 MG) BY MOUTH DAILY, Disp: 90 tablet, Rfl: 1   estradiol  (ESTRACE ) 2 MG tablet, TAKE 1 TABLET BY MOUTH EVERY DAY, Disp: 90 tablet, Rfl: 0   meloxicam  (MOBIC ) 15 MG tablet, Take 1 tablet (15 mg total) by mouth daily., Disp: 90 tablet, Rfl: 1   metoprolol  tartrate (LOPRESSOR ) 50 MG tablet, Take 1 tablet (50 mg total) by mouth 3 (three) times daily., Disp: 270 tablet, Rfl: 2   montelukast  (SINGULAIR ) 10 MG tablet, TAKE 1 TABLET(10 MG) BY MOUTH AT BEDTIME, Disp: 90 tablet, Rfl: 0   Omega-3 Fatty Acids (FISH OIL) 1000 MG CAPS, Take 1 capsule by mouth daily., Disp: , Rfl:    rosuvastatin  (CRESTOR ) 20 MG tablet, Take 1 tablet (20 mg total) by mouth daily., Disp: 90 tablet, Rfl: 3  Past Medical History:  Diagnosis Date   Anxiety    Hyperlipidemia    Objective:  PHYSICAL EXAM:   BP 110/80   Pulse 60   Temp 98.3 F (36.8 C) (Temporal)   Resp 18   Ht 5' 7 (1.702 m)   Wt 193 lb 3.2 oz (87.6 kg)   SpO2 98%   BMI 30.26 kg/m  {Vitals History (Optional):23777}  GEN: Well nourished, well developed, in no acute distress  HEENT: normal external ears and nose - normal external auditory canals and TMS - hearing grossly normal - normal nasal mucosa and septum - Lips, Teeth and Gums - normal  Oropharynx - normal mucosa, palate, and posterior pharynx Neck: no JVD or masses - no thyromegaly Cardiac: RRR; no murmurs, rubs, or gallops,no edema - no significant varicosities Respiratory:  normal respiratory rate and pattern with no distress - normal breath sounds with no rales, rhonchi, wheezes or rubs GI: normal bowel sounds, no masses or tenderness MS: no deformity or atrophy  Skin: warm and dry, no rash  Neuro:  Alert and Oriented x  3, Strength and sensation are intact - CN II-Xii grossly intact Psych: euthymic mood, appropriate affect and demeanor  Assessment & Plan:  *** Mixed hyperlipidemia  Supraventricular tachycardia  Anxiety  Needs flu shot -     Flu vaccine, recombinant, trivalent, inj     Follow-up: Return in about 6 months (around 07/13/2024) for chronic fasting follow-up.  An After Visit Summary was printed and given to the patient.  CAMIE JONELLE NICHOLAUS DEVONNA [facility] [phone]

## 2024-01-14 NOTE — Progress Notes (Signed)
 Subjective:  Patient ID: Jeanne Jacobson, female    DOB: 01/17/1971  Age: 53 y.o. MRN: [id number]  Chief Complaint  Patient presents with   Hyperlipidemia    HPI  Mixed hyperlipidemia  Pt presents with hyperlipidemia. . Compliance with treatment has been good -The patient is compliant with medications, maintains a low cholesterol diet , follows up as directed , and maintains an exercise regimen . The patient denies experiencing any hypercholesterolemia related symptoms. Currently taking crestor  20mg  every day and fish oil - lipid panel done yesterday was normal  Pt with history of SVT - follows with cardiology   She states her symptoms are currently controlled on lopressor  50mg  qd Denies chest pain or shortness of breath  Pt with history of chronic low back pain with intermittent sciatica - she uses  meloxicam  which is helpful for symptoms - no current symptoms today  Pt with history of anxiety - she is currently on citalopram  20mg  every day and doing well on that medication  Pt would like to schedule mammogram and screening colonoscopy Pt would like flu shot today Current Outpatient Medications on File Prior to Visit  Medication Sig Dispense Refill   citalopram  (CELEXA ) 20 MG tablet TAKE 1 TABLET(20 MG) BY MOUTH DAILY 90 tablet 1   estradiol  (ESTRACE ) 2 MG tablet TAKE 1 TABLET BY MOUTH EVERY DAY 90 tablet 0   meloxicam  (MOBIC ) 15 MG tablet Take 1 tablet (15 mg total) by mouth daily. 90 tablet 1   metoprolol  tartrate (LOPRESSOR ) 50 MG tablet Take 1 tablet (50 mg total) by mouth 3 (three) times daily. 270 tablet 2   montelukast  (SINGULAIR ) 10 MG tablet TAKE 1 TABLET(10 MG) BY MOUTH AT BEDTIME 90 tablet 0   Omega-3 Fatty Acids (FISH OIL) 1000 MG CAPS Take 1 capsule by mouth daily.     rosuvastatin  (CRESTOR ) 20 MG tablet Take 1 tablet (20 mg total) by mouth daily. 90 tablet 3   No current facility-administered medications on file prior to visit.   Past Medical History:  Diagnosis  Date   Anxiety    Hyperlipidemia    Past Surgical History:  Procedure Laterality Date   ABDOMINAL HYSTERECTOMY     OOPHORECTOMY Right    TONSILLECTOMY     TUBAL LIGATION      Family History  Problem Relation Age of Onset   Coronary artery disease Father    Social History   Socioeconomic History   Marital status: Married    Spouse name: Not on file   Number of children: 2   Years of education: Not on file   Highest education level: Not on file  Occupational History   Occupation: sports administrator  Tobacco Use   Smoking status: Never   Smokeless tobacco: Never  Vaping Use   Vaping status: Never Used  Substance and Sexual Activity   Alcohol use: Yes    Comment: social   Drug use: Never   Sexual activity: Not on file  Other Topics Concern   Not on file  Social History Narrative   Not on file   Social Drivers of Health   Financial Resource Strain: Low Risk  (09/26/2022)   Overall Financial Resource Strain (CARDIA)    Difficulty of Paying Living Expenses: Not hard at all  Food Insecurity: No Food Insecurity (09/26/2022)   Hunger Vital Sign    Worried About Running Out of Food in the Last Year: Never true    Ran Out of Food in the Last Year: Never  true  Transportation Needs: No Transportation Needs (09/26/2022)   PRAPARE - Administrator, Civil Service (Medical): No    Lack of Transportation (Non-Medical): No  Physical Activity: Inactive (09/26/2022)   Exercise Vital Sign    Days of Exercise per Week: 0 days    Minutes of Exercise per Session: 0 min  Stress: No Stress Concern Present (09/26/2022)   [employer] of Occupational Health - Occupational Stress Questionnaire    Feeling of Stress : Not at all  Social Connections: Moderately Integrated (09/26/2022)   Social Connection and Isolation Panel    Frequency of Communication with Friends and Family: More than three times a week    Frequency of Social Gatherings with Friends and Family: More than three times  a week    Attends Religious Services: More than 4 times per year    Active Member of [employer] or Organizations: No    Attends Banker Meetings: Never    Marital Status: Married   CONSTITUTIONAL: Negative for chills, fatigue, fever,  E/N/T: Negative for ear pain, nasal congestion and sore throat.  CARDIOVASCULAR: Negative for chest pain, dizziness, palpitations and pedal edema.  RESPIRATORY: Negative for recent cough and dyspnea.  GASTROINTESTINAL: Negative for abdominal pain, acid reflux symptoms, constipation, diarrhea, nausea and vomiting.  MSK: Negative for arthralgias and myalgias.  INTEGUMENTARY: Negative for rash.  NEUROLOGICAL: Negative for dizziness and headaches.  PSYCHIATRIC: Negative for sleep disturbance and to question depression screen.  Negative for depression, negative for anhedonia.       Objective:  PHYSICAL EXAM:   VS: BP 110/80   Pulse 60   Temp 98.3 F (36.8 C) (Temporal)   Resp 18   Ht 5' 7 (1.702 m)   Wt 193 lb 3.2 oz (87.6 kg)   SpO2 98%   BMI 30.26 kg/m   GEN: Well nourished, well developed, in no acute distress  Cardiac: RRR; no murmurs, rubs, or gallops,no edema - Respiratory:  normal respiratory rate and pattern with no distress - normal breath sounds with no rales, rhonchi, wheezes or rubs GI: normal bowel sounds, no masses or tenderness MS: no deformity or atrophy  Skin: warm and dry, no rash  Neuro:  Alert and Oriented x 3 - CN II-Xii grossly intact Psych: euthymic mood, appropriate affect and demeanor   Lab Results  Component Value Date   WBC 5.5 05/23/2023   HGB 11.8 05/23/2023   HCT 35.7 05/23/2023   PLT 184 05/23/2023   GLUCOSE 89 05/23/2023   CHOL 170 01/13/2024   TRIG 136 01/13/2024   HDL 65 01/13/2024   LDLCALC 82 01/13/2024   ALT 11 01/13/2024   AST 11 01/13/2024   NA 139 05/23/2023   K 5.3 (H) 05/23/2023   CL 103 05/23/2023   CREATININE 0.85 05/23/2023   BUN 16 05/23/2023   CO2 23 05/23/2023   TSH 3.000  05/23/2023      Assessment & Plan:    Mixed hyperlipidemia Labwork pending Continue meds Watch diet Supraventricular tachycardia Continue med Follow up with cardiology as directed Anxiety Continue citalopram  20mg  qd Chronic right-sided low back pain with right-sided sciatica Continue meds prn Need flu shot Flublok given Breast cancer screening Mammogram ordered Colon cancer screening Colonoscopy ordered  No orders of the defined types were placed in this encounter.   Orders Placed This Encounter  Procedures   Flu vaccine, recombinant, trivalent, inj     Follow-up: Return in about 6 months (around 07/13/2024) for chronic fasting  follow-up.  An After Visit Summary was printed and given to the patient.  CAMIE JONELLE NICHOLAUS DEVONNA [facility] [phone]

## 2024-01-15 ENCOUNTER — Other Ambulatory Visit: Payer: Self-pay | Admitting: Physician Assistant

## 2024-01-15 ENCOUNTER — Ambulatory Visit: Payer: Self-pay | Admitting: Physician Assistant

## 2024-01-15 DIAGNOSIS — R899 Unspecified abnormal finding in specimens from other organs, systems and tissues: Secondary | ICD-10-CM

## 2024-01-15 LAB — CBC WITH DIFFERENTIAL/PLATELET
Basophils Absolute: 0 x10E3/uL (ref 0.0–0.2)
Basos: 1 %
EOS (ABSOLUTE): 0.1 x10E3/uL (ref 0.0–0.4)
Eos: 2 %
Hematocrit: 35.5 % (ref 34.0–46.6)
Hemoglobin: 11.9 g/dL (ref 11.1–15.9)
Immature Grans (Abs): 0 x10E3/uL (ref 0.0–0.1)
Immature Granulocytes: 0 %
Lymphocytes Absolute: 2.5 x10E3/uL (ref 0.7–3.1)
Lymphs: 42 %
MCH: 31.7 pg (ref 26.6–33.0)
MCHC: 33.5 g/dL (ref 31.5–35.7)
MCV: 95 fL (ref 79–97)
Monocytes Absolute: 0.5 x10E3/uL (ref 0.1–0.9)
Monocytes: 8 %
Neutrophils Absolute: 2.8 x10E3/uL (ref 1.4–7.0)
Neutrophils: 47 %
Platelets: 202 x10E3/uL (ref 150–450)
RBC: 3.75 x10E6/uL — ABNORMAL LOW (ref 3.77–5.28)
RDW: 12.3 % (ref 11.7–15.4)
WBC: 6 x10E3/uL (ref 3.4–10.8)

## 2024-01-15 LAB — COMPREHENSIVE METABOLIC PANEL WITH GFR
ALT: 12 IU/L (ref 0–32)
AST: 14 IU/L (ref 0–40)
Albumin: 4.2 g/dL (ref 3.8–4.9)
Alkaline Phosphatase: 65 IU/L (ref 49–135)
BUN/Creatinine Ratio: 31 — ABNORMAL HIGH (ref 9–23)
BUN: 26 mg/dL — ABNORMAL HIGH (ref 6–24)
Bilirubin Total: <0.2 mg/dL (ref 0.0–1.2)
CO2: 24 mmol/L (ref 20–29)
Calcium: 9.6 mg/dL (ref 8.7–10.2)
Chloride: 101 mmol/L (ref 96–106)
Creatinine, Ser: 0.83 mg/dL (ref 0.57–1.00)
Globulin, Total: 2.5 g/dL (ref 1.5–4.5)
Glucose: 85 mg/dL (ref 70–99)
Potassium: 4.8 mmol/L (ref 3.5–5.2)
Sodium: 137 mmol/L (ref 134–144)
Total Protein: 6.7 g/dL (ref 6.0–8.5)
eGFR: 84 mL/min/1.73 (ref >59)

## 2024-01-15 LAB — TSH: TSH: 4.52 u[IU]/mL — ABNORMAL HIGH (ref 0.450–4.500)

## 2024-01-15 LAB — VITAMIN D 25 HYDROXY (VIT D DEFICIENCY, FRACTURES): Vit D, 25-Hydroxy: 36.9 ng/mL (ref 30.0–100.0)

## 2024-01-19 ENCOUNTER — Other Ambulatory Visit: Payer: Self-pay | Admitting: Physician Assistant

## 2024-01-19 MED ORDER — ROSUVASTATIN CALCIUM 20 MG PO TABS: 20.0000 mg | ORAL_TABLET | Freq: Every day | ORAL | 1 refills | Status: DC

## 2024-01-20 ENCOUNTER — Ambulatory Visit: Payer: Self-pay | Admitting: Cardiology

## 2024-01-20 DIAGNOSIS — E782 Mixed hyperlipidemia: Secondary | ICD-10-CM

## 2024-01-24 ENCOUNTER — Other Ambulatory Visit: Payer: Self-pay | Admitting: Physician Assistant

## 2024-01-24 DIAGNOSIS — J3089 Other allergic rhinitis: Secondary | ICD-10-CM

## 2024-01-27 ENCOUNTER — Other Ambulatory Visit: Payer: Self-pay | Admitting: Emergency Medicine

## 2024-01-27 MED ORDER — ROSUVASTATIN CALCIUM 40 MG PO TABS: 40.0000 mg | ORAL_TABLET | Freq: Every day | ORAL | 3 refills | Status: AC

## 2024-01-28 NOTE — Telephone Encounter (Signed)
 Patient called back and was made aware of Dr. Karry recommendations. Lab work and recommendations routed to patient's PCP. Denies any questions/concerns at present.  Alan, RN

## 2024-01-28 NOTE — Telephone Encounter (Signed)
 LVM for patient to return call.  Alan, RN

## 2024-02-14 ENCOUNTER — Other Ambulatory Visit: Payer: Self-pay | Admitting: Physician Assistant

## 2024-02-17 ENCOUNTER — Other Ambulatory Visit

## 2024-02-17 DIAGNOSIS — R899 Unspecified abnormal finding in specimens from other organs, systems and tissues: Secondary | ICD-10-CM

## 2024-02-18 ENCOUNTER — Ambulatory Visit: Payer: Self-pay | Admitting: Physician Assistant

## 2024-02-18 LAB — TSH: TSH: 2.5 u[IU]/mL (ref 0.450–4.500)

## 2024-02-18 LAB — T4, FREE: Free T4: 0.74 ng/dL — ABNORMAL LOW (ref 0.82–1.77)

## 2024-03-03 ENCOUNTER — Other Ambulatory Visit: Payer: Self-pay | Admitting: Physician Assistant

## 2024-03-08 ENCOUNTER — Other Ambulatory Visit: Payer: Self-pay | Admitting: Physician Assistant

## 2024-03-12 ENCOUNTER — Other Ambulatory Visit: Payer: Self-pay | Admitting: Physician Assistant

## 2024-03-12 DIAGNOSIS — F419 Anxiety disorder, unspecified: Secondary | ICD-10-CM

## 2024-07-15 ENCOUNTER — Ambulatory Visit: Admitting: Physician Assistant

## 2024-07-16 ENCOUNTER — Ambulatory Visit: Admitting: Physician Assistant
